# Patient Record
Sex: Female | Born: 1960
Health system: Southern US, Community
[De-identification: ages and names within clinical notes are randomized; demographics above are authoritative.]

## PROBLEM LIST (undated history)

## (undated) DIAGNOSIS — K219 Gastro-esophageal reflux disease without esophagitis: Secondary | ICD-10-CM

## (undated) DIAGNOSIS — K5900 Constipation, unspecified: Secondary | ICD-10-CM

## (undated) DIAGNOSIS — J069 Acute upper respiratory infection, unspecified: Secondary | ICD-10-CM

## (undated) DIAGNOSIS — M51369 Other intervertebral disc degeneration, lumbar region without mention of lumbar back pain or lower extremity pain: Secondary | ICD-10-CM

## (undated) DIAGNOSIS — I10 Essential (primary) hypertension: Secondary | ICD-10-CM

## (undated) DIAGNOSIS — N390 Urinary tract infection, site not specified: Secondary | ICD-10-CM

## (undated) DIAGNOSIS — M5136 Other intervertebral disc degeneration, lumbar region: Secondary | ICD-10-CM

## (undated) HISTORY — PX: INCONTINENCE SURGERY: SHX676

## (undated) HISTORY — PX: ABDOMINAL HYSTERECTOMY: SHX81

## (undated) HISTORY — PX: CARPAL TUNNEL RELEASE: SHX101

---

## 1998-09-13 ENCOUNTER — Emergency Department (HOSPITAL_COMMUNITY): Admission: EM | Admit: 1998-09-13 | Discharge: 1998-09-13 | Payer: Self-pay | Admitting: Family Medicine

## 1999-05-06 ENCOUNTER — Encounter: Payer: Self-pay | Admitting: Obstetrics and Gynecology

## 1999-05-06 ENCOUNTER — Ambulatory Visit (HOSPITAL_COMMUNITY): Admission: RE | Admit: 1999-05-06 | Discharge: 1999-05-06 | Payer: Self-pay | Admitting: Obstetrics and Gynecology

## 1999-06-01 ENCOUNTER — Encounter: Payer: Self-pay | Admitting: Occupational Medicine

## 1999-06-01 ENCOUNTER — Ambulatory Visit: Admission: RE | Admit: 1999-06-01 | Discharge: 1999-06-01 | Payer: Self-pay | Admitting: Occupational Medicine

## 2000-04-30 ENCOUNTER — Other Ambulatory Visit: Admission: RE | Admit: 2000-04-30 | Discharge: 2000-04-30 | Payer: Self-pay | Admitting: Obstetrics and Gynecology

## 2000-05-11 ENCOUNTER — Encounter: Payer: Self-pay | Admitting: Obstetrics and Gynecology

## 2000-05-11 ENCOUNTER — Ambulatory Visit (HOSPITAL_COMMUNITY): Admission: RE | Admit: 2000-05-11 | Discharge: 2000-05-11 | Payer: Self-pay | Admitting: Obstetrics and Gynecology

## 2000-11-19 ENCOUNTER — Emergency Department (HOSPITAL_COMMUNITY): Admission: EM | Admit: 2000-11-19 | Discharge: 2000-11-19 | Payer: Self-pay | Admitting: Emergency Medicine

## 2001-05-14 ENCOUNTER — Ambulatory Visit (HOSPITAL_COMMUNITY): Admission: RE | Admit: 2001-05-14 | Discharge: 2001-05-14 | Payer: Self-pay | Admitting: Obstetrics and Gynecology

## 2001-05-14 ENCOUNTER — Encounter: Payer: Self-pay | Admitting: Obstetrics and Gynecology

## 2001-06-03 ENCOUNTER — Other Ambulatory Visit: Admission: RE | Admit: 2001-06-03 | Discharge: 2001-06-03 | Payer: Self-pay | Admitting: Obstetrics and Gynecology

## 2002-03-25 ENCOUNTER — Encounter: Payer: Self-pay | Admitting: Urology

## 2002-03-27 ENCOUNTER — Observation Stay (HOSPITAL_COMMUNITY): Admission: RE | Admit: 2002-03-27 | Discharge: 2002-03-28 | Payer: Self-pay | Admitting: Urology

## 2002-05-16 ENCOUNTER — Ambulatory Visit (HOSPITAL_COMMUNITY): Admission: RE | Admit: 2002-05-16 | Discharge: 2002-05-16 | Payer: Self-pay | Admitting: Obstetrics and Gynecology

## 2002-05-16 ENCOUNTER — Encounter: Payer: Self-pay | Admitting: Obstetrics and Gynecology

## 2002-09-01 ENCOUNTER — Inpatient Hospital Stay (HOSPITAL_COMMUNITY): Admission: RE | Admit: 2002-09-01 | Discharge: 2002-09-04 | Payer: Self-pay | Admitting: Urology

## 2002-09-13 ENCOUNTER — Emergency Department (HOSPITAL_COMMUNITY): Admission: EM | Admit: 2002-09-13 | Discharge: 2002-09-13 | Payer: Self-pay | Admitting: Emergency Medicine

## 2003-02-19 ENCOUNTER — Encounter: Payer: Self-pay | Admitting: Obstetrics and Gynecology

## 2003-02-19 ENCOUNTER — Encounter: Admission: RE | Admit: 2003-02-19 | Discharge: 2003-02-19 | Payer: Self-pay | Admitting: Obstetrics and Gynecology

## 2003-05-04 ENCOUNTER — Ambulatory Visit (HOSPITAL_COMMUNITY): Admission: RE | Admit: 2003-05-04 | Discharge: 2003-05-04 | Payer: Self-pay | Admitting: Obstetrics and Gynecology

## 2003-05-04 ENCOUNTER — Encounter: Payer: Self-pay | Admitting: Obstetrics and Gynecology

## 2003-05-27 ENCOUNTER — Encounter: Admission: RE | Admit: 2003-05-27 | Discharge: 2003-05-27 | Payer: Self-pay | Admitting: Obstetrics and Gynecology

## 2003-05-27 ENCOUNTER — Encounter: Payer: Self-pay | Admitting: Obstetrics and Gynecology

## 2004-04-13 ENCOUNTER — Emergency Department (HOSPITAL_COMMUNITY): Admission: EM | Admit: 2004-04-13 | Discharge: 2004-04-13 | Payer: Self-pay | Admitting: Family Medicine

## 2004-04-20 ENCOUNTER — Ambulatory Visit (HOSPITAL_COMMUNITY): Admission: RE | Admit: 2004-04-20 | Discharge: 2004-04-20 | Payer: Self-pay | Admitting: Orthopedic Surgery

## 2004-04-20 ENCOUNTER — Encounter (INDEPENDENT_AMBULATORY_CARE_PROVIDER_SITE_OTHER): Payer: Self-pay | Admitting: *Deleted

## 2004-04-20 ENCOUNTER — Ambulatory Visit (HOSPITAL_BASED_OUTPATIENT_CLINIC_OR_DEPARTMENT_OTHER): Admission: RE | Admit: 2004-04-20 | Discharge: 2004-04-20 | Payer: Self-pay | Admitting: Orthopedic Surgery

## 2004-05-04 ENCOUNTER — Ambulatory Visit (HOSPITAL_COMMUNITY): Admission: RE | Admit: 2004-05-04 | Discharge: 2004-05-04 | Payer: Self-pay | Admitting: Obstetrics and Gynecology

## 2005-05-05 ENCOUNTER — Ambulatory Visit (HOSPITAL_COMMUNITY): Admission: RE | Admit: 2005-05-05 | Discharge: 2005-05-05 | Payer: Self-pay | Admitting: Obstetrics and Gynecology

## 2005-06-16 ENCOUNTER — Emergency Department (HOSPITAL_COMMUNITY): Admission: EM | Admit: 2005-06-16 | Discharge: 2005-06-16 | Payer: Self-pay | Admitting: Emergency Medicine

## 2005-06-28 ENCOUNTER — Emergency Department (HOSPITAL_COMMUNITY): Admission: EM | Admit: 2005-06-28 | Discharge: 2005-06-28 | Payer: Self-pay | Admitting: Family Medicine

## 2005-08-09 ENCOUNTER — Emergency Department (HOSPITAL_COMMUNITY): Admission: EM | Admit: 2005-08-09 | Discharge: 2005-08-09 | Payer: Self-pay | Admitting: Family Medicine

## 2006-06-28 ENCOUNTER — Ambulatory Visit (HOSPITAL_COMMUNITY): Admission: RE | Admit: 2006-06-28 | Discharge: 2006-06-28 | Payer: Self-pay | Admitting: Family Medicine

## 2007-07-03 ENCOUNTER — Emergency Department (HOSPITAL_COMMUNITY): Admission: EM | Admit: 2007-07-03 | Discharge: 2007-07-03 | Payer: Self-pay | Admitting: Family Medicine

## 2007-07-14 ENCOUNTER — Emergency Department (HOSPITAL_COMMUNITY): Admission: EM | Admit: 2007-07-14 | Discharge: 2007-07-14 | Payer: Self-pay | Admitting: Family Medicine

## 2007-07-16 ENCOUNTER — Ambulatory Visit (HOSPITAL_COMMUNITY): Admission: RE | Admit: 2007-07-16 | Discharge: 2007-07-16 | Payer: Self-pay | Admitting: Family Medicine

## 2007-11-09 ENCOUNTER — Emergency Department (HOSPITAL_COMMUNITY): Admission: EM | Admit: 2007-11-09 | Discharge: 2007-11-09 | Payer: Self-pay | Admitting: Emergency Medicine

## 2008-04-24 ENCOUNTER — Ambulatory Visit (HOSPITAL_BASED_OUTPATIENT_CLINIC_OR_DEPARTMENT_OTHER): Admission: RE | Admit: 2008-04-24 | Discharge: 2008-04-24 | Payer: Self-pay | Admitting: Urology

## 2009-01-27 ENCOUNTER — Emergency Department (HOSPITAL_COMMUNITY): Admission: EM | Admit: 2009-01-27 | Discharge: 2009-01-27 | Payer: Self-pay | Admitting: Family Medicine

## 2009-03-15 ENCOUNTER — Ambulatory Visit (HOSPITAL_COMMUNITY): Admission: RE | Admit: 2009-03-15 | Discharge: 2009-03-15 | Payer: Self-pay | Admitting: Family Medicine

## 2010-04-25 ENCOUNTER — Ambulatory Visit (HOSPITAL_COMMUNITY): Admission: RE | Admit: 2010-04-25 | Discharge: 2010-04-25 | Payer: Self-pay | Admitting: Family Medicine

## 2010-07-11 ENCOUNTER — Emergency Department (HOSPITAL_COMMUNITY): Admission: EM | Admit: 2010-07-11 | Discharge: 2010-07-11 | Payer: Self-pay | Admitting: Family Medicine

## 2010-07-11 ENCOUNTER — Ambulatory Visit (HOSPITAL_COMMUNITY): Admission: RE | Admit: 2010-07-11 | Discharge: 2010-07-11 | Payer: Self-pay | Admitting: Family Medicine

## 2010-07-14 ENCOUNTER — Encounter: Admission: RE | Admit: 2010-07-14 | Discharge: 2010-07-14 | Payer: Self-pay | Admitting: Family Medicine

## 2010-08-06 ENCOUNTER — Emergency Department (HOSPITAL_COMMUNITY): Admission: EM | Admit: 2010-08-06 | Discharge: 2010-08-06 | Payer: Self-pay | Admitting: Emergency Medicine

## 2011-01-16 ENCOUNTER — Encounter: Payer: Self-pay | Admitting: Family Medicine

## 2011-03-10 LAB — URINALYSIS, ROUTINE W REFLEX MICROSCOPIC
Glucose, UA: NEGATIVE mg/dL
Ketones, ur: NEGATIVE mg/dL
Leukocytes, UA: NEGATIVE
Specific Gravity, Urine: 1.011 (ref 1.005–1.030)
Urobilinogen, UA: 1 mg/dL (ref 0.0–1.0)
pH: 7 (ref 5.0–8.0)

## 2011-03-10 LAB — BASIC METABOLIC PANEL
BUN: 15 mg/dL (ref 6–23)
CO2: 32 mEq/L (ref 19–32)
Calcium: 9.5 mg/dL (ref 8.4–10.5)
Creatinine, Ser: 0.66 mg/dL (ref 0.4–1.2)
GFR calc Af Amer: >60 mL/min (ref >60)
Sodium: 141 mEq/L (ref 135–145)

## 2011-03-10 LAB — URINE MICROSCOPIC-ADD ON

## 2011-03-10 LAB — DIFFERENTIAL
Basophils Absolute: 0 10*3/uL (ref 0.0–0.1)
Basophils Relative: 1 % (ref 0–1)
Lymphocytes Relative: 37 % (ref 12–46)
Monocytes Absolute: 0.3 10*3/uL (ref 0.1–1.0)
Neutro Abs: 2.4 10*3/uL (ref 1.7–7.7)

## 2011-03-10 LAB — CBC
HCT: 36.9 % (ref 36.0–46.0)
Hemoglobin: 12.2 g/dL (ref 12.0–15.0)
MCV: 84.1 fL (ref 78.0–100.0)

## 2011-03-11 LAB — POCT URINALYSIS DIP (DEVICE)
Ketones, ur: NEGATIVE mg/dL
Nitrite: NEGATIVE
Specific Gravity, Urine: 1.025 (ref 1.005–1.030)
Urobilinogen, UA: 0.2 mg/dL (ref 0.0–1.0)
pH: 6.5 (ref 5.0–8.0)

## 2011-04-06 ENCOUNTER — Other Ambulatory Visit (HOSPITAL_COMMUNITY): Payer: Self-pay | Admitting: Family Medicine

## 2011-04-06 DIAGNOSIS — Z1231 Encounter for screening mammogram for malignant neoplasm of breast: Secondary | ICD-10-CM

## 2011-05-09 NOTE — Op Note (Signed)
NAMEOLIVE, ZMUDA             ACCOUNT NO.:  [id number]   MEDICAL RECORD NO.:  [id number]          PATIENT TYPE:  AMB   LOCATION:  NESC                         FACILITY:  [facility]   PHYSICIAN:  Excell Seltzer. Annabell Howells, M.D.    DATE OF BIRTH:  05-08-1961   DATE OF PROCEDURE:  04/24/2008  DATE OF DISCHARGE:                               OPERATIVE REPORT   PREOPERATIVE DIAGNOSIS:  Foreign body in the bladder.   POSTOPERATIVE DIAGNOSIS:  Foreign body in the bladder.   PROCEDURE PERFORMED:  Cystoscopy, removal of foreign bodies from  the  bladder, complicated   SURGEON:  Excell Seltzer. Annabell Howells, M.D.   ASSISTANT:  Melina Schools, M.D.   ANESTHESIA:  General.   INDICATIONS FOR PROCEDURE:  Ms. Janee Morn is a 50 year old female with a  history of a midurethral sling.  She has had recurrent urinary tract  infections.  Office cystoscopic examination revealed foreign body.  She  was brought to the operating room for removal.   DESCRIPTION OF PROCEDURE IN DETAIL:  The patient was brought to the  operating.  She is identified by arm band.  Informed consent was  verified and preoperative time-out was performed.  After successful  induction of anesthesia, the patient was moved to the dorsal lithotomy  position where all appropriate pressure points were padded to avoid  neuropraxic  compartment syndrome.  Perineum was prepped and draped.  Surgeon was Designer, jewellery.  A 22-French cystoscopic sheath was used  to introduce a 12 degree cystoscopic lens transurethrally into the  bladder.  Pan cystourethroscopy was performed.  Bilateral ureteral  orifices were noted to be in the normal anatomic position along the  trigone.  Each was seen to efflux clear urine.  On the patient's right  posterior bladder wall there was an area of inflammation with an obvious  foreign body that looked like a strand of mesh.  This was grasped with a  grasper.  Attempts to remove it were, at that point, unsuccessful with  grasper.   Please note at that time it was very difficult to keep the  bladder inflated because of leakage per urethra.   At this time we inserted the 500 micron laser fiber via the working port  of the scope.  Attempts to amputate the strand of mesh were  unsuccessful.  We then used biopsy forceps to cut the mesh.  We were  able to remove all exposed areas of mesh.  We then removed the 12-French  scope and inserted the 70 degree scope.  There were no other areas that  could be seen of exposed foreign body.  We then drained the bladder and  reinserted the scope.  There was a small amount of bleeding, but no  significant hemorrhage.  The area appeared to collapse upon itself.  The  decision was made not to fulgurate so as to allow healing.  At this  time, the cystoscope was removed.  An 57-  [place] Foley catheter was inserted transurethrally into the bladder and  balloon inflated with 10 mL sterile water.  It was placed to straight  drain.  At this time the procedure was terminated.  The patient  tolerated the procedure well.  Bjorn Pippin was the attending, primary and  responsible physician and was present and participated in the procedure.     ______________________________  Melina Schools, M.D.      Excell Seltzer. Annabell Howells, M.D.  Electronically Signed    JR/MEDQ  D:  04/24/2008  T:  04/24/2008  Job:  [id number]

## 2011-05-12 NOTE — Op Note (Signed)
Carla Frank, Carla Frank                      ACCOUNT NO.:  [id number]   MEDICAL RECORD NO.:  [id number]                   PATIENT TYPE:  INP   LOCATION:  S010                                 FACILITY:  [facility]   PHYSICIAN:  Claudette Laws, M.D.               DATE OF BIRTH:  07/15/1961   DATE OF PROCEDURE:  09/01/2002  DATE OF DISCHARGE:                                 OPERATIVE REPORT   PREOPERATIVE DIAGNOSIS:  Sling material erosion into bladder.   POSTOPERATIVE DIAGNOSIS:  Sling material erosion into bladder.   PROCEDURES:  1. Cystoscopy.  2. Suprapubic exploration with removal of polypropylene mesh material from     bladder.   SURGEON:  Claudette Laws, M.D.   ASSISTANT:  Sigmund I. Patsi Sears, M.D.   RESIDENT:  Crecencio Mc, MD   ANESTHESIA:  General.   COMPLICATIONS:  None.   ESTIMATED BLOOD LOSS:  50 cc.   INDICATION:  The patient is a 50 year old white female with a history of  stress urinary incontinence, status post a SPARC urethral sling.  The  patient subsequently presented a few month later with recurrent infections  and hematuria.  Upon office cystoscopy, the patient was noted to have  polypropylene mesh sling material in the lateral portions of each side of  the bladder.  After discussing options with the patient, it was elected to  proceed with intraoperative cystoscopy with removal of the mesh.  It was  explained that this would likely need to be done through an open suprapubic  approach.  Potential risks and benefits of the procedure were explained to  the patient, and she consented.   DESCRIPTION OF PROCEDURE:  The patient was taken to the operating room, and  a general anesthetic was administered.  The patient was placed in the dorsal  lithotomy position, administered preoperative antibiotics, and prepped and  draped in the usual sterile fashion.  Next, cystoscopy was performed with  the 12 and 70 degree lens.  This revealed an approximately 2 cm  piece of  mesh along the right lateral wall of the bladder up towards the dome.  In  addition, there was a smaller piece of mesh visible on the contralateral  side of the bladder.  Examination of the remainder of the bladder was  unremarkable.  The bladder was then filled with sterile water, and an 18  French Foley catheter was placed into the bladder and plugged.  Attention  then turned to the lower abdomen.  The patient's previous Pfannenstiel  incision was excised in an elliptical manner, and this incision was then  brought down through the subcutaneous tissues with Bovie electrocautery.  There was noted to be extensive scar tissue present.  This was carried down  to the level of the rectus fascia which was then divided in a transverse  fashion.  The midline was then identified, and this was opened in between  the rectus muscles.  The retropubic space was then identified, and the  bladder was carefully dissected from the abdominal wall on either side and  anteriorly from the pubis.  A small peritoneal incision was made just  superior to the bladder.  After mobilizing the bladder, inspection did  reveal a thin portion of the bladder anteriorly.  It was therefore decided  to begin the cystotomy incision at this site, and it was brought up towards  the dome of the bladder in the midline.  Allis clamps were then placed on  the lateral edges of the bladder, and a malleable retractor was placed in  the bladder to visualize the mucosa.  The mesh on the right side of the  bladder was first identified, and a right-angle was able to be passed  underneath this.  Electrocautery was then used to excise this portion of the  bladder mucosa which was then closed with a 4-0 Vicryl running suture.  In a  similar fashion, the mesh on the left side of the bladder was identified.  Again, a right-angle was able to be passed underneath it, and this area was  also excised to incorporate this portion of bladder  into the cystotomy  incision.  The bladder was then closed with a running 2-0 Vicryl full-  thickness suture.  A piece of Tutoplast mesh was then placed over the  cystotomy closure, and a layer of detrusor muscle was then closed over the  cadaveric mesh.  The bladder was then irrigated and appeared to be  watertight.  A #10 Blake drain was then placed in the retropubic space and  brought out through the abdomen through a separate stab incision.  The  peritoneal opening was then closed with a 2-0 Vicryl with care to avoid any  injury to the bowel.  The rectus fascia was then reapproximated with a  running #1 PDS suture.  The wound was then copiously irrigated, and the skin  was reapproximated with staples, and a dressing was applied.  A Foley  catheter was left to straight drain, and the procedure was ended.  There  were no complications, and the patient appeared to tolerate the procedure  well.  She was able to be awakened and transferred to the recovery unit in  satisfactory condition.  Please note that all sponge and needle counts were  noted to be correct at the end of the procedure.  Please note that Dr. Etta Grandchild  was the operating surgeon and was present and participated in this entire  procedure.     Crecencio Mc, M.D.                          Claudette Laws, M.D.    LB/MEDQ  D:  09/01/2002  T:  09/01/2002  Job:  [id number]

## 2011-05-12 NOTE — Op Note (Signed)
NAME:  Carla Frank, Carla Frank                       ACCOUNT NO.:  [id number]   MEDICAL RECORD NO.:  [id number]                   PATIENT TYPE:  AMB   LOCATION:  DSC                                  FACILITY:  [facility]   PHYSICIAN:  Artist Pais. Mina Marble, M.D.           DATE OF BIRTH:  January 03, 1961   DATE OF PROCEDURE:  04/20/2004  DATE OF DISCHARGE:                                 OPERATIVE REPORT   PREOPERATIVE DIAGNOSES:  Mass, right long finger, radial side, proximal  interphalangeal joint.   POSTOPERATIVE DIAGNOSES:  Mass, right long finger, radial side, proximal  interphalangeal joint.   PROCEDURE:  Excision of mass, deep right long finger.   SURGEON:  Artist Pais. Mina Marble, M.D.   ASSISTANT:  Aura Fey. Bobbe Medico.   ANESTHESIA:  General.   TOURNIQUET TIME:  21 minutes.   COMPLICATIONS:  None.   DRAINS:  None.   DESCRIPTION OF PROCEDURE:  The patient was taken to the operating room.  After the induction of adequate general anesthesia, the right upper  extremity was prepped and draped in the usual sterile fashion.  An Esmarch  was used to exsanguinate the limb.  Tourniquet was inflated to 250 mmHg.  At  this point in time, an oblique incision was made over mass just distal to  the proximal interphalangeal joint, radial side of the PIP joint, right long  finger.  Incision was taken down through the skin and subcutaneous tissue.  This was carried down to neurovascular bundle, identified and retracted with  Bovie.  The mass was excised along with the skin.  The wound was then  thoroughly irrigated and closed with 3-0 nylon.  A sterile dressing with  Xeroform, 4 x 4 fluffs, and Coban wrap was applied.  The patient tolerated  the procedure well  and went to recovery in stable fashion.                                               Artist Pais Mina Marble, M.D.    MAW/MEDQ  D:  04/20/2004  T:  04/20/2004  Job:  [id number]

## 2011-05-12 NOTE — Discharge Summary (Signed)
[facility]  Patient:    ZABDI, MIS Visit Number: [id number] MRN: [id number]          Service Type: SUR Location: 3W 5284 01 Attending Physician:  Monica Becton Dictated by:   Claudette Laws, M.D. Admit Date:  03/27/2002 Discharge Date: 03/28/2002                             Discharge Summary  OVERNIGHT OBSERVATION  HISTORY:  This is a 50 year old lady we have been following now for about two years with stress urinary incontinence.  She has tried various conservative measures without success.  We have discussed treatment options, and a decision was made to proceed with a pubovaginal sling procedure.  We discussed carefully with her preoperatively the nature of a sling, different types of material available, and what to expect postoperatively.  We mentioned complications such as postoperative chronic retention possibly requiring a takedown of the sling as well as just the opposite, failure of the sling to correct the incompetence.  We also mentioned the possibility of urgency/frequency syndrome developing after sling procedure.  I also mentioned the possibility of infection of the sling requiring removal.  She understands and agrees to the proposed surgery.  The patient otherwise is basically in good health.  She has had three children.  She has hypertension.  LABORATORY DATA:  Electrolytes were normal except for a potassium of 3.1 which we attributed to HCTZ.  This was not felt to be a contraindication to surgery. Her glucose was 117, BUN 6, creatinine 0.7.  Hemoglobin 11.7, hematocrit 34.7, white cell count 5200.  EKG showed normal sinus rhythm.  COURSE IN HOSPITAL:  The patient came in as an a.m. admission on March 27, 2002, and underwent a pubovaginal sling procedure using a Sparc sling. Postoperatively, I put in an 18-French Foley catheter, observed her overnight. By the next day, she was feeling well with not much discomfort.   We removed the vaginal packing.  I elected to leave the Foley in for about four days and then remove it in the office for a trial of voiding.  FINAL DIAGNOSES: 1. Stress urinary incontinence. 2. Hypertension.  OPERATION: 1. Pubovaginal sling procedure (Sparc). 2. Cystoscopy.  COMPLICATIONS:  None.  CONDITION UPON DISCHARGE:  Stable.  DISCHARGE MEDICATIONS:  To include Cipro 250 mg b.i.d., #14, and Tylox #20 p.r.n. pain.  She refused to renew her blood pressure medication.  DISPOSITION:  Regular diet, force fluids.  Limited activity.  Foley catheter to a leg bag, and return to see me in three days for catheter removal. Dictated by:   Claudette Laws, M.D. Attending Physician:  Monica Becton DD:  03/28/02 TD:  03/29/02 Job: [id number]

## 2011-05-12 NOTE — Op Note (Signed)
[facility]  Patient:    Carla Frank, Carla Frank Visit Number: [id number] MRN: [id number]          Service Type: Attending:  Claudette Laws, M.D. Dictated by:   Claudette Laws, M.D. Proc. Date: 03/27/02                             Operative Report  PREOPERATIVE DIAGNOSIS:  Stress urinary incontinence.  POSTOPERATIVE DIAGNOSIS:  Stress urinary incontinence.  OPERATION: 1. Pubovaginal sling procedure [facility]). 2. Cystoscopy.  SURGEON:  Claudette Laws, M.D.  DESCRIPTION OF PROCEDURE:  The patient was prepped and draped in the dorsal lithotomy position under LMA anesthesia.  The labia were sewn back with 2-0 silk sutures.  Vaginal speculum was placed as was a Foley catheter.  A 2 cm incision was made in the midline about midway between the bladder neck and the urethral meatus.  Dissection was then carried out laterally to just beneath the endopelvic fascia, then suprapubically, two puncture wounds were made on either side of the midline about 2 cm lateral.  Then using the [facility] needles, these were passed down suprapubically, walking down the back portion of the pubic bone using fingertip control for a puncture of the ureteropelvic ligament.  Cystoscopy was then performed, showing an inadvertent puncture on the left side.  The needle was then repassed, and recystoscoped examination showing no bladder perforation.  SPARC sling was then attached to the ends of the needles and then brought up suprapubically.  I left a pair of Mayo scissors between the urethra and the sling, making sure that the sling was not too tight at the final position.  We then cut off the needles suprapubically and cut the sling just beneath the skin suprapubic area.  Copious amounts of bug juice were used.  The plastic sheaths were then removed.  We then closed the vaginal incision with a running 2-0 Vicryl suture.  Iodoform for vaginal packing, and Bacitracin ointment.  An 46 French  10 cc Foley catheter.  I closed the two incisions suprapubically with a subcuticular 4-0 Vicryl stitch. The patient was then taken back to the recovery room in satisfactory condition. Dictated by:   Claudette Laws, M.D. Attending:  Claudette Laws, M.D. DD:  03/27/02 TD:  03/27/02 Job: [id number]

## 2011-05-12 NOTE — Discharge Summary (Signed)
NAME:  Carla Frank, Carla Frank                       ACCOUNT NO.:  [id number]   MEDICAL RECORD NO.:  [id number]                   PATIENT TYPE:  INP   LOCATION:  0355                                 FACILITY[facility]  [facility]   PHYSICIAN:  Claudette Laws, M.D.               DATE OF BIRTH:  21-May-1961   DATE OF ADMISSION:  09/01/2002  DATE OF DISCHARGE:  09/04/2002                                 DISCHARGE SUMMARY   HISTORY:  This is a 50 year old lady who about four months ago underwent a  pubovaginal sling using a SPARC mesh.  Postoperatively she did well except  for some hematuria and some recurrent UTIs and then flexible cystoscopy  recently revealed that the sling had eroded into the bladder near the dome  bilaterally.  After a consultation it was felt that the best option would be  open bladder exploration and removal of the mesh.  The patient agrees to the  proposed surgery.  We discussed complications such as postoperative wound  infection, bleeding, possible having to remove the entire sling if it  becomes infected.  She understands and agrees to the proposed surgery.   LABORATORY DATA:  Electrolytes were normal except for potassium 3.4, BUN 12,  creatinine 1.0.  Hemoglobin 12.3, hematocrit 37.2.   HOSPITAL COURSE:  The patient came in as an a.m. admission on September 01, 2002.  Underwent suprapubic exploration with a cystotomy and removal of the  polypropylene mesh from the bladder on both sides.  Postoperatively we left  the Foley catheter in place and also a 10 [place] JP drain.  Over the next  three days she had a slow progressive course, but at discharge was afebrile,  feeling well.  The catheter was draining well.  There was minimal amount of  bleeding which I anticipate will clear up.  By the third day there was  minimal postoperative JP drainage so we removed the drain and she was sent  home to have her come back in five days for skin staple removal.  At  discharge she was eating  well.  Incision was healing nicely and we plan to  keep the Foley in now for two to three weeks.  She has a very thin bladder.  We also placed some Tutoplast over the cystotomy site to help with bladder  closure as it was a very difficult closure.   DISCHARGE DIAGNOSES:  Polyethylene sling material in bladder secondary to  erosion following a SPARC pubovaginal sling procedure.   OPERATION:  Cystoscopy with bladder exploration, cystotomy, and excision of  mesh from the bladder.   COMPLICATIONS:  None.   CONDITION ON DISCHARGE:  Recovery.   DISCHARGE MEDICATIONS:  Home medications plus Tylox number 30 p.r.n. pain  and Cipro 250 b.i.d. number 14.   DISCHARGE INSTRUCTIONS:  Return to the office in five days for staple  removal.  Claudette Laws, M.D.    RFS/MEDQ  D:  09/04/2002  T:  09/04/2002  Job:  [id number]

## 2011-06-05 ENCOUNTER — Ambulatory Visit (HOSPITAL_COMMUNITY)
Admission: RE | Admit: 2011-06-05 | Discharge: 2011-06-05 | Disposition: A | Discharge status: Home / Self Care | Payer: BC Managed Care – HMO | Source: Ambulatory Visit | Attending: Family Medicine | Admitting: Family Medicine

## 2011-06-05 DIAGNOSIS — Z1231 Encounter for screening mammogram for malignant neoplasm of breast: Secondary | ICD-10-CM | POA: Insufficient documentation

## 2011-09-20 LAB — POCT I-STAT, CHEM 8
Chloride: 100
Creatinine, Ser: 0.9
Hemoglobin: 13.9
Sodium: 141

## 2011-10-03 LAB — DIFFERENTIAL
Basophils Absolute: 0
Basophils Relative: 0
Eosinophils Absolute: 0 — ABNORMAL LOW
Eosinophils Relative: 0
Monocytes Relative: 9

## 2011-10-03 LAB — BASIC METABOLIC PANEL
BUN: 5 — ABNORMAL LOW
CO2: 32
Calcium: 9.1
Creatinine, Ser: 0.86
Potassium: 3.4 — ABNORMAL LOW
Sodium: 138

## 2011-10-03 LAB — URINALYSIS, ROUTINE W REFLEX MICROSCOPIC
Bilirubin Urine: NEGATIVE
Protein, ur: 100 — AB
Specific Gravity, Urine: 1.021
pH: 6

## 2011-10-03 LAB — CBC
HCT: 32.8 — ABNORMAL LOW
Hemoglobin: 11.4 — ABNORMAL LOW
RBC: 3.95
WBC: 8.6

## 2011-10-03 LAB — URINE MICROSCOPIC-ADD ON

## 2011-10-09 LAB — URINE CULTURE: Colony Count: >=100000

## 2011-10-09 LAB — POCT URINALYSIS DIP (DEVICE)
Glucose, UA: NEGATIVE
Nitrite: NEGATIVE
Operator id: 235561
Specific Gravity, Urine: 1.015
Urobilinogen, UA: 1
pH: 6

## 2011-10-10 LAB — URINE CULTURE: Colony Count: 50000

## 2011-10-10 LAB — POCT URINALYSIS DIP (DEVICE)
Bilirubin Urine: NEGATIVE
Ketones, ur: NEGATIVE
Nitrite: NEGATIVE
pH: 7

## 2011-12-07 ENCOUNTER — Other Ambulatory Visit: Payer: Self-pay | Admitting: Orthopedic Surgery

## 2011-12-07 DIAGNOSIS — G8929 Other chronic pain: Secondary | ICD-10-CM

## 2011-12-07 DIAGNOSIS — M545 Low back pain, unspecified: Secondary | ICD-10-CM

## 2011-12-07 DIAGNOSIS — M542 Cervicalgia: Secondary | ICD-10-CM

## 2011-12-10 ENCOUNTER — Ambulatory Visit
Admission: RE | Admit: 2011-12-10 | Discharge: 2011-12-10 | Disposition: A | Discharge status: Home / Self Care | Payer: BC Managed Care – HMO | Source: Ambulatory Visit | Attending: Orthopedic Surgery | Admitting: Orthopedic Surgery

## 2011-12-10 DIAGNOSIS — M545 Low back pain, unspecified: Secondary | ICD-10-CM

## 2011-12-10 DIAGNOSIS — G8929 Other chronic pain: Secondary | ICD-10-CM

## 2012-05-14 ENCOUNTER — Other Ambulatory Visit (HOSPITAL_COMMUNITY): Payer: Self-pay | Admitting: Obstetrics and Gynecology

## 2012-05-14 DIAGNOSIS — Z1231 Encounter for screening mammogram for malignant neoplasm of breast: Secondary | ICD-10-CM

## 2012-06-07 ENCOUNTER — Ambulatory Visit (HOSPITAL_COMMUNITY)
Admission: RE | Admit: 2012-06-07 | Discharge: 2012-06-07 | Disposition: A | Discharge status: Home / Self Care | Payer: BC Managed Care – HMO | Source: Ambulatory Visit | Attending: Obstetrics and Gynecology | Admitting: Obstetrics and Gynecology

## 2012-06-07 DIAGNOSIS — Z1231 Encounter for screening mammogram for malignant neoplasm of breast: Secondary | ICD-10-CM | POA: Insufficient documentation

## 2013-05-13 ENCOUNTER — Other Ambulatory Visit (HOSPITAL_COMMUNITY): Payer: Self-pay | Admitting: Family Medicine

## 2013-05-13 DIAGNOSIS — Z1231 Encounter for screening mammogram for malignant neoplasm of breast: Secondary | ICD-10-CM

## 2013-06-05 ENCOUNTER — Ambulatory Visit (HOSPITAL_COMMUNITY): Payer: BC Managed Care – HMO

## 2013-06-09 ENCOUNTER — Ambulatory Visit (HOSPITAL_COMMUNITY): Payer: BC Managed Care – PPO

## 2013-06-12 ENCOUNTER — Ambulatory Visit (HOSPITAL_COMMUNITY)
Admission: RE | Admit: 2013-06-12 | Discharge: 2013-06-12 | Disposition: A | Discharge status: Home / Self Care | Payer: BC Managed Care – PPO | Source: Ambulatory Visit | Attending: Family Medicine | Admitting: Family Medicine

## 2013-06-12 DIAGNOSIS — Z1231 Encounter for screening mammogram for malignant neoplasm of breast: Secondary | ICD-10-CM | POA: Insufficient documentation

## 2013-07-11 ENCOUNTER — Emergency Department (HOSPITAL_COMMUNITY): Payer: BC Managed Care – HMO

## 2013-07-11 ENCOUNTER — Encounter (HOSPITAL_COMMUNITY): Payer: Self-pay | Admitting: *Deleted

## 2013-07-11 ENCOUNTER — Emergency Department (HOSPITAL_COMMUNITY)
Admission: EM | Admit: 2013-07-11 | Discharge: 2013-07-11 | Disposition: A | Discharge status: Home / Self Care | Payer: BC Managed Care – HMO | Attending: Emergency Medicine | Admitting: Emergency Medicine

## 2013-07-11 DIAGNOSIS — R42 Dizziness and giddiness: Secondary | ICD-10-CM | POA: Insufficient documentation

## 2013-07-11 HISTORY — DX: Essential (primary) hypertension: I10

## 2013-07-11 LAB — CBC WITH DIFFERENTIAL/PLATELET
Lymphocytes Relative: 28 % (ref 12–46)
Lymphs Abs: 1 10*3/uL (ref 0.7–4.0)
Neutro Abs: 2.1 10*3/uL (ref 1.7–7.7)
Neutrophils Relative %: 61 % (ref 43–77)
Platelets: 240 10*3/uL (ref 150–400)
RBC: 4.34 MIL/uL (ref 3.87–5.11)
WBC: 3.4 10*3/uL — ABNORMAL LOW (ref 4.0–10.5)

## 2013-07-11 LAB — POCT I-STAT TROPONIN I: Troponin i, poc: 0 ng/mL (ref 0.00–0.08)

## 2013-07-11 LAB — BASIC METABOLIC PANEL
CO2: 28 mEq/L (ref 19–32)
GFR calc non Af Amer: >90 mL/min (ref >90)
Glucose, Bld: 121 mg/dL — ABNORMAL HIGH (ref 70–99)
Potassium: 3.3 mEq/L — ABNORMAL LOW (ref 3.5–5.1)
Sodium: 139 mEq/L (ref 135–145)

## 2013-07-11 LAB — URINALYSIS, ROUTINE W REFLEX MICROSCOPIC
Glucose, UA: NEGATIVE mg/dL
Hgb urine dipstick: NEGATIVE
Specific Gravity, Urine: 1.009 (ref 1.005–1.030)
Urobilinogen, UA: 1 mg/dL (ref 0.0–1.0)

## 2013-07-11 LAB — URINE MICROSCOPIC-ADD ON

## 2013-07-11 MED ORDER — LORAZEPAM 2 MG/ML IJ SOLN
1.0000 mg | Freq: Once | INTRAMUSCULAR | Status: AC
  Administered 2013-07-11: 1 mg via INTRAVENOUS

## 2013-07-11 MED ORDER — SODIUM CHLORIDE 0.9 % IV BOLUS (SEPSIS)
1000.0000 mL | Freq: Once | INTRAVENOUS | Status: AC
  Administered 2013-07-11: 1000 mL via INTRAVENOUS

## 2013-07-11 MED ORDER — MECLIZINE HCL 25 MG PO TABS
25.0000 mg | ORAL_TABLET | Freq: Once | ORAL | Status: AC
  Administered 2013-07-11: 25 mg via ORAL
  Filled 2013-07-11: qty 1

## 2013-07-11 MED ORDER — MECLIZINE HCL 50 MG PO TABS: 50.0000 mg | ORAL_TABLET | Freq: Three times a day (TID) | ORAL | Status: DC | PRN

## 2013-07-11 MED ORDER — ONDANSETRON HCL 4 MG/2ML IJ SOLN
4.0000 mg | Freq: Once | INTRAMUSCULAR | Status: AC
  Administered 2013-07-11: 4 mg via INTRAVENOUS
  Filled 2013-07-11: qty 2

## 2013-07-11 MED ORDER — LORAZEPAM 2 MG/ML IJ SOLN
INTRAMUSCULAR | Status: AC
  Filled 2013-07-11: qty 1

## 2013-07-11 MED ORDER — ONDANSETRON 4 MG PO TBDP
4.0000 mg | ORAL_TABLET | Freq: Once | ORAL | Status: DC
  Filled 2013-07-11: qty 1

## 2013-07-11 MED ORDER — GADOBENATE DIMEGLUMINE 529 MG/ML IV SOLN
20.0000 mL | Freq: Once | INTRAVENOUS | Status: AC | PRN
  Administered 2013-07-11: 17 mL via INTRAVENOUS

## 2013-07-11 NOTE — ED Notes (Signed)
Pt states that she is feeling less dizzy at the present.  No distress noted.  Resp symmetrical and unlabored.

## 2013-07-11 NOTE — ED Provider Notes (Signed)
Medical screening examination/treatment/procedure(s) were conducted as a shared visit with non-physician practitioner(s) and myself.  I personally evaluated the patient during the encounter  Episodes of "dizziness" x 3 weeks described as lightheadedness, sometimes vertigo. No chest pain or SOB. EKG nsr. Doubt ACS. MRI to r/o central cause of vertigo. CN 2-12 intact, no ataxia on finger to nose, no nystagmus, 5/5 strength throughout, no pronator drift, Romberg negative, normal gait, visual fields full to confrontation.   Glynn Octave, MD 07/11/13 [phone]

## 2013-07-11 NOTE — ED Notes (Signed)
Pt reports that over the last 3 weeks she has been having difficulty ambulating due to dizziness.  Her primary MD has been reportedly changing her medications.

## 2013-07-11 NOTE — ED Provider Notes (Signed)
History    CSN: [id number] Arrival date & time 07/11/13  0716  First MD Initiated Contact with Patient 07/11/13 [phone]     No chief complaint on file.  (Consider location/radiation/quality/duration/timing/severity/associated sxs/prior Treatment) HPI  52 year old female with history of hypertension presents for evaluations for dizziness. Patient reports intermittent bouts of dizziness ongoing for the past 3 weeks. Describe dizziness as initially lightheadedness but now has room spinning sensation. Symptoms usually lasting for about 30 minutes, usually worsen with motion change, however now has been progressively getting worse. Endorse mild nausea only when she is dizzy.  Endorse occasional throbbing headache to the back of her head lasting for less than 30 minutes. No headache at this time. No complaint of fever, chills, ear pain, ringing in the ears, sneeze, runny nose, cough, sore throat, chest pain, shortness of breath, abdominal pain, dysuria, abnormal bleeding, or rash. Patient has been drinking as usual. Since the development of the symptoms patient has been seen by her primary care doctor who has alternative blood pressure medication, however this has not improved symptoms. Patient otherwise denies any prior history of stroke, nonsmoker, denies any confusion, having numbness or weakness.  No past medical history on file. No past surgical history on file. No family history on file. History  Substance Use Topics  . Smoking status: Not on file  . Smokeless tobacco: Not on file  . Alcohol Use: Not on file   OB History   No data available     Review of Systems  All other systems reviewed and are negative.    Allergies  Review of patient's allergies indicates not on file.  Home Medications  No current outpatient prescriptions on file. There were no vitals taken for this visit. Physical Exam  Nursing note and vitals reviewed. Constitutional: She is oriented to person, place,  and time. She appears well-developed and well-nourished. No distress.  Awake, alert, nontoxic appearance  HENT:  Head: Atraumatic.  Eyes: Conjunctivae are normal. Right eye exhibits no discharge. Left eye exhibits no discharge.  Neck: Neck supple.  Cardiovascular: Normal rate and regular rhythm.   Pulmonary/Chest: Effort normal. No respiratory distress. She exhibits no tenderness.  Abdominal: Soft. There is no tenderness. There is no rebound.  Musculoskeletal: She exhibits no tenderness.  ROM appears intact, no obvious focal weakness  Neurological: She is alert and oriented to person, place, and time.  Speech clear, pupils equal round reactive to light, extraocular movements intact   Normal peripheral visual fields Cranial nerves III through XII normal including no facial droop Follows commands, moves all extremities x4, normal strength to bilateral upper and lower extremities at all major muscle groups including grip Sensation normal to light touch and pinprick Coordination intact, no limb ataxia, finger-nose-finger normal Rapid alternating movements normal No pronator drift Gait normal   Skin: No rash noted.  Psychiatric: She has a normal mood and affect.    ED Course  Procedures (including critical care time)   Date: 07/11/2013  Rate: 85  Rhythm: normal sinus rhythm  QRS Axis: normal  Intervals: normal  ST/T Wave abnormalities: nonspecific ST/T changes  Conduction Disutrbances:right bundle branch block  Narrative Interpretation:   Old EKG Reviewed: none available    7:45 AM Pt with c/o dizziness.  Normal orthostatic VS.  ECG showing RBBB, no prior for comparison.  Although it has some component to suggest peripheral vertigo, due to the duration and her sxs persists with rest, pt will benefit from further eval to r/o central causes.  Sxs not worsen after Dix-Hallpike maneuver.    9:02 AM My attending has evaluate pt, and we both felt pt will benefit from MRA head/neck,  MRI brain to r/o posterior circulation stroke.  Pt otherwise felt a bit better after receiving meclizine.    1:24 PM Pt felt better.  MRI/MRA head and neck without acute finding concerning for stroke.  Will d/c with meclizine, pt to f/u with PCP for further care.  Return precaution discussed. Care discussed with attending.    Labs Reviewed  CBC WITH DIFFERENTIAL - Abnormal; Notable for the following:    WBC 3.4 (*)    Hemoglobin 11.6 (*)    HCT 35.5 (*)    All other components within normal limits  BASIC METABOLIC PANEL - Abnormal; Notable for the following:    Potassium 3.3 (*)    Glucose, Bld 121 (*)    All other components within normal limits  URINALYSIS, ROUTINE W REFLEX MICROSCOPIC - Abnormal; Notable for the following:    Leukocytes, UA TRACE (*)    All other components within normal limits  URINE MICROSCOPIC-ADD ON - Abnormal; Notable for the following:    Squamous Epithelial / LPF FEW (*)    All other components within normal limits  POCT I-STAT TROPONIN I   Mr Angiogram Head Wo Contrast  07/11/2013   *RADIOLOGY REPORT*  Clinical Data:  Dizziness.  High blood pressure.  MRI BRAIN WITH AND WITHOUT CONTRAST MRA HEAD WITHOUT CONTRAST MRA NECK WITHOUT AND WITH CONTRAST  Technique: Multiplanar, multiecho pulse sequences of the brain and surrounding structures were obtained according to standard protocol with and without intravenous contrast.  Angiographic images of the Circle of Willis were obtained using MRA technique without intravenous contrast.  Angiographic images of the neck were obtained using MRA technique without and with intravenous contrast.  Contrast:17 ml MultiHance.  Comparison:No comparison head CT or brain MR.  MRI HEAD  Findings: No acute infarct.  No intracranial hemorrhage.  Minimal nonspecific white matter type changes may be related to small vessel disease in this hypertensive patient.  No hydrocephalus.  No intracranial mass lesion detected on this unenhanced exam.   Cerebellar tonsils minimally low-lying but within the range of normal limits.  Partially empty sella.  Pineal region unremarkable.  Mild exophthalmos.  Minimal paranasal sinus mucosal thickening.  Mild spinal stenosis C3-4 and C4-5.  IMPRESSION: No acute infarct.  Please see above.  MRA HEAD  Findings:Anterior circulation without medium or large size vessel significant stenosis or occlusion.  Ectatic vertebral arteries and basilar artery.  No high-grade stenosis.  Minimal irregularity.  Mild irregularity and narrowing of portions of the superior cerebellar artery bilaterally more notable on the left.  Minimal bulge middle cerebral artery bilaterally appears to be origin of a vessel rather than aneurysm.  IMPRESSION: No medium or large size vessel significant stenosis or occlusion as noted above.  MRA NECK  Findings:There may be a common origin of the left common carotid artery and innominate artery.  Mild narrowing proximal left internal carotid artery without hemodynamically significant stenosis or irregularity.  No significant narrowing of the proximal right internal carotid artery.  Mid to distal aspect of the vertical cervical segment of the internal carotid artery is markedly ectatic bilaterally without irregularity to suggest the presence of underlying fibromuscular dysplasia.  Codominant vertebral arteries without significant narrowing.  No significant narrowing of the subclavian arteries.  IMPRESSION: No evidence of hemodynamically significant stenosis involving either carotid bifurcation or vertebral arteries.  Markedly ectatic  internal carotid artery cervical segment without findings of fibromuscular dysplasia.   Original Report Authenticated By: Lacy Duverney, M.D.   Mr Angiogram Neck W Wo Contrast  07/11/2013   *RADIOLOGY REPORT*  Clinical Data:  Dizziness.  High blood pressure.  MRI BRAIN WITH AND WITHOUT CONTRAST MRA HEAD WITHOUT CONTRAST MRA NECK WITHOUT AND WITH CONTRAST  Technique: Multiplanar,  multiecho pulse sequences of the brain and surrounding structures were obtained according to standard protocol with and without intravenous contrast.  Angiographic images of the Circle of Willis were obtained using MRA technique without intravenous contrast.  Angiographic images of the neck were obtained using MRA technique without and with intravenous contrast.  Contrast:17 ml MultiHance.  Comparison:No comparison head CT or brain MR.  MRI HEAD  Findings: No acute infarct.  No intracranial hemorrhage.  Minimal nonspecific white matter type changes may be related to small vessel disease in this hypertensive patient.  No hydrocephalus.  No intracranial mass lesion detected on this unenhanced exam.  Cerebellar tonsils minimally low-lying but within the range of normal limits.  Partially empty sella.  Pineal region unremarkable.  Mild exophthalmos.  Minimal paranasal sinus mucosal thickening.  Mild spinal stenosis C3-4 and C4-5.  IMPRESSION: No acute infarct.  Please see above.  MRA HEAD  Findings:Anterior circulation without medium or large size vessel significant stenosis or occlusion.  Ectatic vertebral arteries and basilar artery.  No high-grade stenosis.  Minimal irregularity.  Mild irregularity and narrowing of portions of the superior cerebellar artery bilaterally more notable on the left.  Minimal bulge middle cerebral artery bilaterally appears to be origin of a vessel rather than aneurysm.  IMPRESSION: No medium or large size vessel significant stenosis or occlusion as noted above.  MRA NECK  Findings:There may be a common origin of the left common carotid artery and innominate artery.  Mild narrowing proximal left internal carotid artery without hemodynamically significant stenosis or irregularity.  No significant narrowing of the proximal right internal carotid artery.  Mid to distal aspect of the vertical cervical segment of the internal carotid artery is markedly ectatic bilaterally without irregularity  to suggest the presence of underlying fibromuscular dysplasia.  Codominant vertebral arteries without significant narrowing.  No significant narrowing of the subclavian arteries.  IMPRESSION: No evidence of hemodynamically significant stenosis involving either carotid bifurcation or vertebral arteries.  Markedly ectatic internal carotid artery cervical segment without findings of fibromuscular dysplasia.   Original Report Authenticated By: Lacy Duverney, M.D.   Mr Brain Wo Contrast  07/11/2013   *RADIOLOGY REPORT*  Clinical Data:  Dizziness.  High blood pressure.  MRI BRAIN WITH AND WITHOUT CONTRAST MRA HEAD WITHOUT CONTRAST MRA NECK WITHOUT AND WITH CONTRAST  Technique: Multiplanar, multiecho pulse sequences of the brain and surrounding structures were obtained according to standard protocol with and without intravenous contrast.  Angiographic images of the Circle of Willis were obtained using MRA technique without intravenous contrast.  Angiographic images of the neck were obtained using MRA technique without and with intravenous contrast.  Contrast:17 ml MultiHance.  Comparison:No comparison head CT or brain MR.  MRI HEAD  Findings: No acute infarct.  No intracranial hemorrhage.  Minimal nonspecific white matter type changes may be related to small vessel disease in this hypertensive patient.  No hydrocephalus.  No intracranial mass lesion detected on this unenhanced exam.  Cerebellar tonsils minimally low-lying but within the range of normal limits.  Partially empty sella.  Pineal region unremarkable.  Mild exophthalmos.  Minimal paranasal sinus mucosal thickening.  Mild  spinal stenosis C3-4 and C4-5.  IMPRESSION: No acute infarct.  Please see above.  MRA HEAD  Findings:Anterior circulation without medium or large size vessel significant stenosis or occlusion.  Ectatic vertebral arteries and basilar artery.  No high-grade stenosis.  Minimal irregularity.  Mild irregularity and narrowing of portions of the  superior cerebellar artery bilaterally more notable on the left.  Minimal bulge middle cerebral artery bilaterally appears to be origin of a vessel rather than aneurysm.  IMPRESSION: No medium or large size vessel significant stenosis or occlusion as noted above.  MRA NECK  Findings:There may be a common origin of the left common carotid artery and innominate artery.  Mild narrowing proximal left internal carotid artery without hemodynamically significant stenosis or irregularity.  No significant narrowing of the proximal right internal carotid artery.  Mid to distal aspect of the vertical cervical segment of the internal carotid artery is markedly ectatic bilaterally without irregularity to suggest the presence of underlying fibromuscular dysplasia.  Codominant vertebral arteries without significant narrowing.  No significant narrowing of the subclavian arteries.  IMPRESSION: No evidence of hemodynamically significant stenosis involving either carotid bifurcation or vertebral arteries.  Markedly ectatic internal carotid artery cervical segment without findings of fibromuscular dysplasia.   Original Report Authenticated By: Lacy Duverney, M.D.   1. Vertigo     MDM  BP 110/74  Pulse 69  Temp(Src) 99.2 F (37.3 C) (Oral)  Resp 14  Ht 5\' 4"  (1.626 m)  Wt 181 lb (82.101 kg)  BMI 31.05 kg/m2  SpO2 97%  I have reviewed nursing notes and vital signs. I personally reviewed the imaging tests through PACS system  I reviewed available ER/hospitalization records thought the EMR       Fayrene Helper, [place] 07/11/13 1331

## 2013-07-11 NOTE — ED Notes (Signed)
Patient transported to MRI 

## 2014-02-17 ENCOUNTER — Other Ambulatory Visit: Payer: Self-pay | Admitting: Family Medicine

## 2014-02-17 ENCOUNTER — Ambulatory Visit
Admission: RE | Admit: 2014-02-17 | Discharge: 2014-02-17 | Disposition: A | Discharge status: Home / Self Care | Payer: BC Managed Care – PPO | Source: Ambulatory Visit | Attending: Family Medicine | Admitting: Family Medicine

## 2014-02-17 DIAGNOSIS — M545 Low back pain, unspecified: Secondary | ICD-10-CM

## 2014-02-20 ENCOUNTER — Other Ambulatory Visit: Payer: Self-pay | Admitting: Family Medicine

## 2014-02-20 DIAGNOSIS — M545 Low back pain, unspecified: Secondary | ICD-10-CM

## 2014-04-17 ENCOUNTER — Other Ambulatory Visit: Payer: Self-pay | Admitting: Neurosurgery

## 2014-05-30 DIAGNOSIS — J069 Acute upper respiratory infection, unspecified: Secondary | ICD-10-CM

## 2014-05-30 HISTORY — DX: Acute upper respiratory infection, unspecified: J06.9

## 2014-06-08 ENCOUNTER — Other Ambulatory Visit (HOSPITAL_COMMUNITY): Payer: Self-pay | Admitting: Obstetrics and Gynecology

## 2014-06-08 DIAGNOSIS — Z1231 Encounter for screening mammogram for malignant neoplasm of breast: Secondary | ICD-10-CM

## 2014-06-11 ENCOUNTER — Other Ambulatory Visit: Payer: Self-pay | Admitting: Obstetrics and Gynecology

## 2014-06-11 DIAGNOSIS — N63 Unspecified lump in unspecified breast: Secondary | ICD-10-CM

## 2014-06-16 ENCOUNTER — Ambulatory Visit (HOSPITAL_COMMUNITY): Payer: BC Managed Care – PPO

## 2014-06-18 ENCOUNTER — Ambulatory Visit
Admission: RE | Admit: 2014-06-18 | Discharge: 2014-06-18 | Disposition: A | Discharge status: Home / Self Care | Payer: BC Managed Care – PPO | Source: Ambulatory Visit | Attending: Obstetrics and Gynecology | Admitting: Obstetrics and Gynecology

## 2014-06-18 DIAGNOSIS — N63 Unspecified lump in unspecified breast: Secondary | ICD-10-CM

## 2014-07-06 ENCOUNTER — Encounter (HOSPITAL_COMMUNITY): Payer: Self-pay | Admitting: Pharmacy Technician

## 2014-07-08 ENCOUNTER — Encounter (HOSPITAL_COMMUNITY): Admission: RE | Admit: 2014-07-08 | Payer: BC Managed Care – PPO | Source: Ambulatory Visit

## 2014-07-08 ENCOUNTER — Encounter (HOSPITAL_COMMUNITY)
Admission: RE | Admit: 2014-07-08 | Discharge: 2014-07-08 | Disposition: A | Discharge status: Home / Self Care | Payer: BC Managed Care – PPO | Source: Ambulatory Visit | Attending: Neurosurgery | Admitting: Neurosurgery

## 2014-07-08 ENCOUNTER — Ambulatory Visit (HOSPITAL_COMMUNITY)
Admission: RE | Admit: 2014-07-08 | Discharge: 2014-07-08 | Disposition: A | Discharge status: Home / Self Care | Payer: BC Managed Care – PPO | Source: Ambulatory Visit | Attending: Anesthesiology | Admitting: Anesthesiology

## 2014-07-08 ENCOUNTER — Encounter (HOSPITAL_COMMUNITY): Payer: Self-pay

## 2014-07-08 DIAGNOSIS — I10 Essential (primary) hypertension: Secondary | ICD-10-CM | POA: Insufficient documentation

## 2014-07-08 DIAGNOSIS — Z01818 Encounter for other preprocedural examination: Secondary | ICD-10-CM | POA: Insufficient documentation

## 2014-07-08 HISTORY — DX: Urinary tract infection, site not specified: N39.0

## 2014-07-08 HISTORY — DX: Acute upper respiratory infection, unspecified: J06.9

## 2014-07-08 HISTORY — DX: Other intervertebral disc degeneration, lumbar region: M51.36

## 2014-07-08 HISTORY — DX: Other intervertebral disc degeneration, lumbar region without mention of lumbar back pain or lower extremity pain: M51.369

## 2014-07-08 HISTORY — DX: Constipation, unspecified: K59.00

## 2014-07-08 HISTORY — DX: Gastro-esophageal reflux disease without esophagitis: K21.9

## 2014-07-08 LAB — CBC WITH DIFFERENTIAL/PLATELET
BASOS ABS: 0 10*3/uL (ref 0.0–0.1)
BASOS PCT: 1 % (ref 0–1)
EOS PCT: 3 % (ref 0–5)
Eosinophils Absolute: 0.1 10*3/uL (ref 0.0–0.7)
HEMATOCRIT: 36.7 % (ref 36.0–46.0)
Hemoglobin: 11.9 g/dL — ABNORMAL LOW (ref 12.0–15.0)
LYMPHS PCT: 31 % (ref 12–46)
Lymphs Abs: 1.2 10*3/uL (ref 0.7–4.0)
MCH: 26.7 pg (ref 26.0–34.0)
MCHC: 32.4 g/dL (ref 30.0–36.0)
MCV: 82.3 fL (ref 78.0–100.0)
MONO ABS: 0.3 10*3/uL (ref 0.1–1.0)
Monocytes Relative: 8 % (ref 3–12)
NEUTROS ABS: 2.3 10*3/uL (ref 1.7–7.7)
Neutrophils Relative %: 57 % (ref 43–77)
PLATELETS: 240 10*3/uL (ref 150–400)
RBC: 4.46 MIL/uL (ref 3.87–5.11)
RDW: 13.8 % (ref 11.5–15.5)
WBC: 4 10*3/uL (ref 4.0–10.5)

## 2014-07-08 LAB — BASIC METABOLIC PANEL
Anion gap: 12 (ref 5–15)
BUN: 14 mg/dL (ref 6–23)
CHLORIDE: 101 meq/L (ref 96–112)
CO2: 30 mEq/L (ref 19–32)
CREATININE: 0.59 mg/dL (ref 0.50–1.10)
Calcium: 9.3 mg/dL (ref 8.4–10.5)
GFR calc Af Amer: >90 mL/min (ref >90)
GFR calc non Af Amer: >90 mL/min (ref >90)
GLUCOSE: 97 mg/dL (ref 70–99)
POTASSIUM: 3.5 meq/L — AB (ref 3.7–5.3)
Sodium: 143 mEq/L (ref 137–147)

## 2014-07-08 LAB — TYPE AND SCREEN
ABO/RH(D): A POS
Antibody Screen: NEGATIVE

## 2014-07-08 LAB — SURGICAL PCR SCREEN
MRSA, PCR: NEGATIVE
Staphylococcus aureus: NEGATIVE

## 2014-07-08 LAB — ABO/RH: ABO/RH(D): A POS

## 2014-07-08 NOTE — Pre-Procedure Instructions (Signed)
Carla Frank  07/08/2014   Your procedure is scheduled on:  Monday, July 27  Report to [facility] Admitting at 0530 AM.  Call this number if you have problems the morning of surgery: [phone]   Remember:   Do not eat food or drink liquids after midnight.Sunday night   Take these medicines the morning of surgery with A SIP OF WATER: gabapentin(Neurontin),ranitidine (Zantac), Flexeril if needed   Do not wear jewelry, make-up or nail polish.  Do not wear lotions, powders, or perfumes. You may NOT wear deodorant.  Do not shave 48 hours prior to surgery.  .  Do not bring valuables to the hospital.  [facility] is not responsible                  for any belongings or valuables.               Contacts, dentures or bridgework may not be worn into surgery.  Leave suitcase in the car. After surgery it may be brought to your room.  For patients admitted to the hospital, discharge time is determined by your                treatment team.      Special Instructions: [facility] - Preparing for Surgery  Before surgery, you can play an important role.  Because skin is not sterile, your skin needs to be as free of germs as possible.  You can reduce the number of germs on you skin by washing with CHG (chlorahexidine gluconate) soap before surgery.  CHG is an antiseptic cleaner which kills germs and bonds with the skin to continue killing germs even after washing.  Please DO NOT use if you have an allergy to CHG or antibacterial soaps.  If your skin becomes reddened/irritated stop using the CHG and inform your nurse when you arrive at Short Stay.  Do not shave (including legs and underarms) for at least 48 hours prior to the first CHG shower.  You may shave your face.  Please follow these instructions carefully:   1.  Shower with CHG Soap the night before surgery and the    morning of Surgery.  2.  If you choose to wash your hair, wash your hair first as usual with your  normal  shampoo.  3.  After you shampoo, rinse your hair and body thoroughly to remove the   Shampoo.  4.  Use CHG as you would any other liquid soap.  You can apply chg directly   to the skin and wash gently with scrungie or a clean washcloth.  5.  Apply the CHG Soap to your body ONLY FROM THE NECK DOWN.    Do not use on open wounds or open sores.  Avoid contact with your eyes,   ears, mouth and genitals (private parts).  Wash genitals (private parts)   with your normal soap.  6.  Wash thoroughly, paying special attention to the area where your surgery   will be performed.  7.  Thoroughly rinse your body with warm water from the neck down.  8.  DO NOT shower/wash with your normal soap after using and rinsing off   the CHG Soap.  9.  Pat yourself dry with a clean towel.            10.  Wear clean pajamas.            11 .  Place clean sheets on your  bed the night of your first shower and do not    sleep with pets.  Day of Surgery  Do not apply any lotions/deoderants the morning of surgery.  Please wear clean clothes to the hospital/surgery center.     Please read over the following fact sheets that you were given: Pain Booklet, Coughing and Deep Breathing, Blood Transfusion Information and Surgical Site Infection Prevention

## 2014-07-09 NOTE — Progress Notes (Signed)
Anesthesia Chart Review:  Patient is a 53 year old female scheduled for L3-4 PLIF on 07/20/14 by Dr. Jordan LikesPool.  History includes non-smoker, HTN, GERD, lumbar DDD, chronic UTI (on daily nitrofurantoin), hysterectomy. BMI is 31.39 consistent with mild obesity.  PCP is listed as Dr. Deatra JamesVyvyan Sun.  EKG on 07/08/14 showed NSR, LAD, right BBB.  No significant change since tracing on 07/11/13. No CV symptoms documented at PAT. No reported history of CAD, CHF, MI, or DM.   CXR on 07/08/14 showed: No active cardiopulmonary disease.  Preoperative labs noted.   Her EKG appears stable.  Further evaluation by her assigned anesthesiologist on the day of surgery, and if no acute changes then I would anticipate that she could proceed as planned.  Carla Ochsllison Elson Ulbrich, PA-C [facility]Anesthesiology Phone [phone] 07/09/2014 9:45 AM

## 2014-07-20 ENCOUNTER — Inpatient Hospital Stay (HOSPITAL_COMMUNITY)
Admission: RE | Admit: 2014-07-20 | Discharge: 2014-07-21 | DRG: 460 | Disposition: A | Discharge status: Home / Self Care | Payer: BC Managed Care – PPO | Source: Ambulatory Visit | Attending: Neurosurgery | Admitting: Neurosurgery

## 2014-07-20 ENCOUNTER — Inpatient Hospital Stay (HOSPITAL_COMMUNITY): Payer: BC Managed Care – PPO | Admitting: Anesthesiology

## 2014-07-20 ENCOUNTER — Inpatient Hospital Stay (HOSPITAL_COMMUNITY): Payer: BC Managed Care – PPO

## 2014-07-20 ENCOUNTER — Encounter (HOSPITAL_COMMUNITY): Payer: BC Managed Care – PPO | Admitting: Vascular Surgery

## 2014-07-20 ENCOUNTER — Encounter (HOSPITAL_COMMUNITY)
Admission: RE | Disposition: A | Discharge status: Home / Self Care | Payer: Self-pay | Source: Ambulatory Visit | Attending: Neurosurgery

## 2014-07-20 ENCOUNTER — Encounter (HOSPITAL_COMMUNITY): Payer: Self-pay | Admitting: Anesthesiology

## 2014-07-20 DIAGNOSIS — K5900 Constipation, unspecified: Secondary | ICD-10-CM | POA: Diagnosis present

## 2014-07-20 DIAGNOSIS — I252 Old myocardial infarction: Secondary | ICD-10-CM

## 2014-07-20 DIAGNOSIS — Z79899 Other long term (current) drug therapy: Secondary | ICD-10-CM

## 2014-07-20 DIAGNOSIS — M4310 Spondylolisthesis, site unspecified: Principal | ICD-10-CM | POA: Diagnosis present

## 2014-07-20 DIAGNOSIS — K219 Gastro-esophageal reflux disease without esophagitis: Secondary | ICD-10-CM | POA: Diagnosis present

## 2014-07-20 DIAGNOSIS — I10 Essential (primary) hypertension: Secondary | ICD-10-CM | POA: Diagnosis present

## 2014-07-20 SURGERY — POSTERIOR LUMBAR FUSION 1 LEVEL
Anesthesia: General | Site: Back

## 2014-07-20 MED ORDER — GLYCOPYRROLATE 0.2 MG/ML IJ SOLN
INTRAMUSCULAR | Status: DC | PRN
  Administered 2014-07-20: 0.4 mg via INTRAVENOUS

## 2014-07-20 MED ORDER — NEOSTIGMINE METHYLSULFATE 10 MG/10ML IV SOLN
INTRAVENOUS | Status: DC | PRN
  Administered 2014-07-20: 3 mg via INTRAVENOUS

## 2014-07-20 MED ORDER — PHENYLEPHRINE 40 MCG/ML (10ML) SYRINGE FOR IV PUSH (FOR BLOOD PRESSURE SUPPORT)
PREFILLED_SYRINGE | INTRAVENOUS | Status: AC
  Filled 2014-07-20: qty 10

## 2014-07-20 MED ORDER — LACTATED RINGERS IV SOLN
INTRAVENOUS | Status: DC | PRN
  Administered 2014-07-20 (×3): via INTRAVENOUS

## 2014-07-20 MED ORDER — PROPOFOL 10 MG/ML IV BOLUS
INTRAVENOUS | Status: DC | PRN
  Administered 2014-07-20: 150 mg via INTRAVENOUS

## 2014-07-20 MED ORDER — STERILE WATER FOR INJECTION IJ SOLN
INTRAMUSCULAR | Status: AC
  Filled 2014-07-20: qty 10

## 2014-07-20 MED ORDER — DIAZEPAM 5 MG PO TABS: 5.0000 mg | ORAL_TABLET | Freq: Four times a day (QID) | ORAL | Status: DC | PRN

## 2014-07-20 MED ORDER — HYDROCODONE-ACETAMINOPHEN 5-325 MG PO TABS: 1.0000 | ORAL_TABLET | ORAL | Status: DC | PRN

## 2014-07-20 MED ORDER — PROPOFOL 10 MG/ML IV BOLUS
INTRAVENOUS | Status: AC
  Filled 2014-07-20: qty 20

## 2014-07-20 MED ORDER — ARTIFICIAL TEARS OP OINT
TOPICAL_OINTMENT | OPHTHALMIC | Status: DC | PRN
  Administered 2014-07-20: 1 via OPHTHALMIC

## 2014-07-20 MED ORDER — GABAPENTIN 100 MG PO CAPS
100.0000 mg | ORAL_CAPSULE | Freq: Three times a day (TID) | ORAL | Status: DC
  Administered 2014-07-20 – 2014-07-21 (×4): 100 mg via ORAL
  Filled 2014-07-20 (×6): qty 1

## 2014-07-20 MED ORDER — HYDROCHLOROTHIAZIDE 25 MG PO TABS
25.0000 mg | ORAL_TABLET | Freq: Every day | ORAL | Status: DC
  Filled 2014-07-20 (×2): qty 1

## 2014-07-20 MED ORDER — MIDAZOLAM HCL 5 MG/5ML IJ SOLN
INTRAMUSCULAR | Status: DC | PRN
  Administered 2014-07-20: 2 mg via INTRAVENOUS

## 2014-07-20 MED ORDER — CEFAZOLIN SODIUM-DEXTROSE 2-3 GM-% IV SOLR
INTRAVENOUS | Status: AC
  Filled 2014-07-20: qty 50

## 2014-07-20 MED ORDER — NITROFURANTOIN MACROCRYSTAL 100 MG PO CAPS
100.0000 mg | ORAL_CAPSULE | Freq: Every day | ORAL | Status: DC
  Administered 2014-07-20 – 2014-07-21 (×2): 100 mg via ORAL
  Filled 2014-07-20 (×2): qty 1

## 2014-07-20 MED ORDER — ROCURONIUM BROMIDE 100 MG/10ML IV SOLN
INTRAVENOUS | Status: DC | PRN
  Administered 2014-07-20: 50 mg via INTRAVENOUS

## 2014-07-20 MED ORDER — FENTANYL CITRATE 0.05 MG/ML IJ SOLN
INTRAMUSCULAR | Status: DC | PRN
  Administered 2014-07-20: 150 ug via INTRAVENOUS

## 2014-07-20 MED ORDER — OXYCODONE HCL 5 MG/5ML PO SOLN: 5.0000 mg | Freq: Once | ORAL | Status: DC | PRN

## 2014-07-20 MED ORDER — SODIUM CHLORIDE 0.9 % IJ SOLN
INTRAMUSCULAR | Status: AC
  Filled 2014-07-20: qty 10

## 2014-07-20 MED ORDER — BACITRACIN 50000 UNITS IM SOLR
INTRAMUSCULAR | Status: DC | PRN
  Administered 2014-07-20: 09:00:00

## 2014-07-20 MED ORDER — THROMBIN 20000 UNITS EX SOLR
CUTANEOUS | Status: DC | PRN
  Administered 2014-07-20: 09:00:00 via TOPICAL

## 2014-07-20 MED ORDER — DEXAMETHASONE SODIUM PHOSPHATE 10 MG/ML IJ SOLN
INTRAMUSCULAR | Status: DC | PRN
  Administered 2014-07-20: 10 mg via INTRAVENOUS

## 2014-07-20 MED ORDER — LIDOCAINE HCL (CARDIAC) 20 MG/ML IV SOLN
INTRAVENOUS | Status: DC | PRN
  Administered 2014-07-20: 80 mg via INTRAVENOUS

## 2014-07-20 MED ORDER — ACETAMINOPHEN 325 MG PO TABS: 650.0000 mg | ORAL_TABLET | ORAL | Status: DC | PRN

## 2014-07-20 MED ORDER — OLMESARTAN-AMLODIPINE-HCTZ 40-10-25 MG PO TABS: 1.0000 | ORAL_TABLET | Freq: Every day | ORAL | Status: DC

## 2014-07-20 MED ORDER — ONDANSETRON HCL 4 MG/2ML IJ SOLN
INTRAMUSCULAR | Status: DC | PRN
  Administered 2014-07-20: 4 mg via INTRAVENOUS

## 2014-07-20 MED ORDER — ONDANSETRON HCL 4 MG/2ML IJ SOLN
4.0000 mg | INTRAMUSCULAR | Status: DC | PRN
  Administered 2014-07-20: 4 mg via INTRAVENOUS
  Filled 2014-07-20: qty 2

## 2014-07-20 MED ORDER — SODIUM CHLORIDE 0.9 % IV SOLN: 250.0000 mL | INTRAVENOUS | Status: DC

## 2014-07-20 MED ORDER — CYCLOBENZAPRINE HCL 10 MG PO TABS
10.0000 mg | ORAL_TABLET | Freq: Two times a day (BID) | ORAL | Status: DC | PRN
  Administered 2014-07-20 – 2014-07-21 (×2): 10 mg via ORAL
  Filled 2014-07-20 (×2): qty 1

## 2014-07-20 MED ORDER — IRBESARTAN 300 MG PO TABS
300.0000 mg | ORAL_TABLET | Freq: Every day | ORAL | Status: DC
  Filled 2014-07-20 (×2): qty 1

## 2014-07-20 MED ORDER — ACETAMINOPHEN 160 MG/5ML PO SOLN
325.0000 mg | ORAL | Status: DC | PRN
  Filled 2014-07-20: qty 20.3

## 2014-07-20 MED ORDER — 0.9 % SODIUM CHLORIDE (POUR BTL) OPTIME
TOPICAL | Status: DC | PRN
  Administered 2014-07-20: 1000 mL

## 2014-07-20 MED ORDER — SODIUM CHLORIDE 0.9 % IJ SOLN: 3.0000 mL | INTRAMUSCULAR | Status: DC | PRN

## 2014-07-20 MED ORDER — BUPIVACAINE HCL (PF) 0.25 % IJ SOLN
INTRAMUSCULAR | Status: DC | PRN
  Administered 2014-07-20: 20 mL

## 2014-07-20 MED ORDER — ROCURONIUM BROMIDE 50 MG/5ML IV SOLN
INTRAVENOUS | Status: AC
  Filled 2014-07-20: qty 1

## 2014-07-20 MED ORDER — CEFAZOLIN SODIUM 1-5 GM-% IV SOLN
1.0000 g | Freq: Three times a day (TID) | INTRAVENOUS | Status: AC
  Administered 2014-07-20 (×2): 1 g via INTRAVENOUS
  Filled 2014-07-20 (×2): qty 50

## 2014-07-20 MED ORDER — SUCCINYLCHOLINE CHLORIDE 20 MG/ML IJ SOLN
INTRAMUSCULAR | Status: AC
  Filled 2014-07-20: qty 1

## 2014-07-20 MED ORDER — DEXAMETHASONE SODIUM PHOSPHATE 4 MG/ML IJ SOLN
INTRAMUSCULAR | Status: AC
  Filled 2014-07-20: qty 3

## 2014-07-20 MED ORDER — GLYCOPYRROLATE 0.2 MG/ML IJ SOLN
INTRAMUSCULAR | Status: AC
  Filled 2014-07-20: qty 2

## 2014-07-20 MED ORDER — CEFAZOLIN SODIUM-DEXTROSE 2-3 GM-% IV SOLR
2.0000 g | Freq: Once | INTRAVENOUS | Status: AC
  Administered 2014-07-20: 2 g via INTRAVENOUS
  Filled 2014-07-20: qty 50

## 2014-07-20 MED ORDER — MENTHOL 3 MG MT LOZG: 1.0000 | LOZENGE | OROMUCOSAL | Status: DC | PRN

## 2014-07-20 MED ORDER — ONDANSETRON HCL 4 MG/2ML IJ SOLN: 4.0000 mg | Freq: Once | INTRAMUSCULAR | Status: DC | PRN

## 2014-07-20 MED ORDER — HYDROMORPHONE HCL PF 1 MG/ML IJ SOLN
INTRAMUSCULAR | Status: AC
  Filled 2014-07-20: qty 1

## 2014-07-20 MED ORDER — LIDOCAINE HCL (CARDIAC) 20 MG/ML IV SOLN
INTRAVENOUS | Status: AC
  Filled 2014-07-20: qty 5

## 2014-07-20 MED ORDER — OXYCODONE HCL 5 MG PO TABS: 5.0000 mg | ORAL_TABLET | Freq: Once | ORAL | Status: DC | PRN

## 2014-07-20 MED ORDER — POTASSIUM CHLORIDE CRYS ER 20 MEQ PO TBCR
20.0000 meq | EXTENDED_RELEASE_TABLET | Freq: Every day | ORAL | Status: DC
  Administered 2014-07-20 – 2014-07-21 (×2): 20 meq via ORAL
  Filled 2014-07-20 (×2): qty 1

## 2014-07-20 MED ORDER — HYDROMORPHONE HCL PF 1 MG/ML IJ SOLN
0.5000 mg | INTRAMUSCULAR | Status: DC | PRN
  Administered 2014-07-20: 1 mg via INTRAVENOUS
  Filled 2014-07-20: qty 1

## 2014-07-20 MED ORDER — VECURONIUM BROMIDE 10 MG IV SOLR
INTRAVENOUS | Status: AC
  Filled 2014-07-20: qty 10

## 2014-07-20 MED ORDER — NEOSTIGMINE METHYLSULFATE 10 MG/10ML IV SOLN
INTRAVENOUS | Status: AC
  Filled 2014-07-20: qty 1

## 2014-07-20 MED ORDER — AMLODIPINE BESYLATE 10 MG PO TABS
10.0000 mg | ORAL_TABLET | Freq: Every day | ORAL | Status: DC
  Filled 2014-07-20 (×2): qty 1

## 2014-07-20 MED ORDER — PHENOL 1.4 % MT LIQD: 1.0000 | OROMUCOSAL | Status: DC | PRN

## 2014-07-20 MED ORDER — FENTANYL CITRATE 0.05 MG/ML IJ SOLN
INTRAMUSCULAR | Status: AC
  Filled 2014-07-20: qty 5

## 2014-07-20 MED ORDER — SENNA 8.6 MG PO TABS
1.0000 | ORAL_TABLET | Freq: Two times a day (BID) | ORAL | Status: DC
Start: 2014-07-20 — End: 2014-07-21
  Administered 2014-07-20 – 2014-07-21 (×3): 8.6 mg via ORAL
  Filled 2014-07-20 (×3): qty 1

## 2014-07-20 MED ORDER — ALUM & MAG HYDROXIDE-SIMETH 200-200-20 MG/5ML PO SUSP: 30.0000 mL | Freq: Four times a day (QID) | ORAL | Status: DC | PRN

## 2014-07-20 MED ORDER — EPHEDRINE SULFATE 50 MG/ML IJ SOLN
INTRAMUSCULAR | Status: AC
  Filled 2014-07-20: qty 1

## 2014-07-20 MED ORDER — ONDANSETRON HCL 4 MG/2ML IJ SOLN
INTRAMUSCULAR | Status: AC
  Filled 2014-07-20: qty 2

## 2014-07-20 MED ORDER — ACETAMINOPHEN 650 MG RE SUPP: 650.0000 mg | RECTAL | Status: DC | PRN

## 2014-07-20 MED ORDER — ACETAMINOPHEN 325 MG PO TABS: 325.0000 mg | ORAL_TABLET | ORAL | Status: DC | PRN

## 2014-07-20 MED ORDER — SODIUM CHLORIDE 0.9 % IJ SOLN
3.0000 mL | Freq: Two times a day (BID) | INTRAMUSCULAR | Status: DC
  Administered 2014-07-20: 3 mL via INTRAVENOUS

## 2014-07-20 MED ORDER — KETOROLAC TROMETHAMINE 30 MG/ML IJ SOLN: 15.0000 mg | Freq: Once | INTRAMUSCULAR | Status: DC | PRN

## 2014-07-20 MED ORDER — GLYCOPYRROLATE 0.2 MG/ML IJ SOLN
INTRAMUSCULAR | Status: AC
  Filled 2014-07-20: qty 1

## 2014-07-20 MED ORDER — HYDROMORPHONE HCL PF 1 MG/ML IJ SOLN
0.2500 mg | INTRAMUSCULAR | Status: DC | PRN
  Administered 2014-07-20: 0.5 mg via INTRAVENOUS

## 2014-07-20 MED ORDER — MIDAZOLAM HCL 2 MG/2ML IJ SOLN
INTRAMUSCULAR | Status: AC
  Filled 2014-07-20: qty 2

## 2014-07-20 MED ORDER — DOCUSATE SODIUM 100 MG PO CAPS
200.0000 mg | ORAL_CAPSULE | Freq: Every day | ORAL | Status: DC | PRN
  Filled 2014-07-20: qty 2

## 2014-07-20 MED ORDER — OXYCODONE-ACETAMINOPHEN 5-325 MG PO TABS
1.0000 | ORAL_TABLET | ORAL | Status: DC | PRN
  Administered 2014-07-20: 1 via ORAL
  Administered 2014-07-20 – 2014-07-21 (×4): 2 via ORAL
  Filled 2014-07-20: qty 1
  Filled 2014-07-20 (×4): qty 2

## 2014-07-20 SURGICAL SUPPLY — 74 items
ADH SKN CLS APL DERMABOND .7 (GAUZE/BANDAGES/DRESSINGS) ×1
ADH SKN CLS LQ APL DERMABOND (GAUZE/BANDAGES/DRESSINGS) ×1
APL SKNCLS STERI-STRIP NONHPOA (GAUZE/BANDAGES/DRESSINGS) ×1
BAG DECANTER FOR FLEXI CONT (MISCELLANEOUS) ×2 IMPLANT
BENZOIN TINCTURE PRP APPL 2/3 (GAUZE/BANDAGES/DRESSINGS) ×2 IMPLANT
BLADE 10 SAFETY STRL DISP (BLADE) ×2 IMPLANT
BLADE SURG ROTATE 9660 (MISCELLANEOUS) IMPLANT
BRUSH SCRUB EZ PLAIN DRY (MISCELLANEOUS) ×2 IMPLANT
BUR MATCHSTICK NEURO 3.0 LAGG (BURR) ×2 IMPLANT
CAGE CAPSTONE BULLET 8X22 (Cage) ×1 IMPLANT
CANISTER SUCT 3000ML (MISCELLANEOUS) ×2 IMPLANT
CAP LCK SPNE (Orthopedic Implant) ×4 IMPLANT
CAP LOCK SPINE RADIUS (Orthopedic Implant) IMPLANT
CAP LOCKING (Orthopedic Implant) ×8 IMPLANT
CONT SPEC 4OZ CLIKSEAL STRL BL (MISCELLANEOUS) ×4 IMPLANT
COVER BACK TABLE 24X17X13 BIG (DRAPES) IMPLANT
COVER TABLE BACK 60X90 (DRAPES) ×2 IMPLANT
DECANTER SPIKE VIAL GLASS SM (MISCELLANEOUS) ×2 IMPLANT
DERMABOND ADHESIVE PROPEN (GAUZE/BANDAGES/DRESSINGS) ×1
DERMABOND ADVANCED (GAUZE/BANDAGES/DRESSINGS) ×1
DERMABOND ADVANCED .7 DNX12 (GAUZE/BANDAGES/DRESSINGS) ×1 IMPLANT
DERMABOND ADVANCED .7 DNX6 (GAUZE/BANDAGES/DRESSINGS) IMPLANT
DRAPE C-ARM 42X72 X-RAY (DRAPES) ×4 IMPLANT
DRAPE LAPAROTOMY 100X72X124 (DRAPES) ×2 IMPLANT
DRAPE POUCH INSTRU U-SHP 10X18 (DRAPES) ×2 IMPLANT
DRAPE PROXIMA HALF (DRAPES) IMPLANT
DRAPE SURG 17X23 STRL (DRAPES) ×8 IMPLANT
DURAPREP 26ML APPLICATOR (WOUND CARE) ×2 IMPLANT
ELECT REM PT RETURN 9FT ADLT (ELECTROSURGICAL) ×2
ELECTRODE REM PT RTRN 9FT ADLT (ELECTROSURGICAL) ×1 IMPLANT
EVACUATOR 1/8 PVC DRAIN (DRAIN) ×2 IMPLANT
GAUZE SPONGE 4X4 16PLY XRAY LF (GAUZE/BANDAGES/DRESSINGS) ×1 IMPLANT
GLOVE BIO SURGEON STRL SZ8 (GLOVE) ×1 IMPLANT
GLOVE BIOGEL PI IND STRL 7.0 (GLOVE) IMPLANT
GLOVE BIOGEL PI IND STRL 7.5 (GLOVE) IMPLANT
GLOVE BIOGEL PI INDICATOR 7.0 (GLOVE) ×3
GLOVE BIOGEL PI INDICATOR 7.5 (GLOVE) ×3
GLOVE ECLIPSE 8.5 STRL (GLOVE) ×1 IMPLANT
GLOVE ECLIPSE 9.0 STRL (GLOVE) ×4 IMPLANT
GLOVE EXAM NITRILE LRG STRL (GLOVE) IMPLANT
GLOVE EXAM NITRILE MD LF STRL (GLOVE) IMPLANT
GLOVE EXAM NITRILE XL STR (GLOVE) IMPLANT
GLOVE EXAM NITRILE XS STR PU (GLOVE) IMPLANT
GLOVE INDICATOR 8.5 STRL (GLOVE) ×1 IMPLANT
GLOVE SS BIOGEL STRL SZ 6.5 (GLOVE) IMPLANT
GLOVE SUPERSENSE BIOGEL SZ 6.5 (GLOVE) ×3
GLOVE SURG SS PI 7.0 STRL IVOR (GLOVE) ×3 IMPLANT
GOWN STRL REUS W/ TWL LRG LVL3 (GOWN DISPOSABLE) IMPLANT
GOWN STRL REUS W/ TWL XL LVL3 (GOWN DISPOSABLE) ×2 IMPLANT
GOWN STRL REUS W/TWL 2XL LVL3 (GOWN DISPOSABLE) IMPLANT
GOWN STRL REUS W/TWL LRG LVL3 (GOWN DISPOSABLE) ×2
GOWN STRL REUS W/TWL XL LVL3 (GOWN DISPOSABLE) ×10
KIT BASIN OR (CUSTOM PROCEDURE TRAY) ×2 IMPLANT
KIT ROOM TURNOVER OR (KITS) ×2 IMPLANT
MILL MEDIUM DISP (BLADE) ×1 IMPLANT
NEEDLE HYPO 22GX1.5 SAFETY (NEEDLE) ×2 IMPLANT
NS IRRIG 1000ML POUR BTL (IV SOLUTION) ×2 IMPLANT
PACK LAMINECTOMY NEURO (CUSTOM PROCEDURE TRAY) ×2 IMPLANT
ROD RADIUS 40MM (Neuro Prosthesis/Implant) ×4 IMPLANT
ROD SPNL 40X5.5XNS TI RDS (Neuro Prosthesis/Implant) IMPLANT
SCREW 5.75X40M (Screw) ×4 IMPLANT
SPONGE GAUZE 4X4 12PLY (GAUZE/BANDAGES/DRESSINGS) ×2 IMPLANT
SPONGE SURGIFOAM ABS GEL 100 (HEMOSTASIS) ×2 IMPLANT
STRIP CLOSURE SKIN 1/2X4 (GAUZE/BANDAGES/DRESSINGS) ×3 IMPLANT
SUT VIC AB 0 CT1 18XCR BRD8 (SUTURE) ×2 IMPLANT
SUT VIC AB 0 CT1 8-18 (SUTURE) ×4
SUT VIC AB 2-0 CT1 18 (SUTURE) ×2 IMPLANT
SUT VIC AB 3-0 SH 8-18 (SUTURE) ×4 IMPLANT
SYR 20ML ECCENTRIC (SYRINGE) ×2 IMPLANT
TOWEL OR 17X24 6PK STRL BLUE (TOWEL DISPOSABLE) ×2 IMPLANT
TOWEL OR 17X26 10 PK STRL BLUE (TOWEL DISPOSABLE) ×2 IMPLANT
TRAY FOLEY CATH 14FRSI W/METER (CATHETERS) ×2 IMPLANT
WATER STERILE IRR 1000ML POUR (IV SOLUTION) ×2 IMPLANT
WEDGE TANGENT 8X26MM ×1 IMPLANT

## 2014-07-20 NOTE — Anesthesia Procedure Notes (Signed)
Procedure Name: Intubation Date/Time: 07/20/2014 8:25 AM Performed by: Lovie CholOCK, Aeliana Spates K Pre-anesthesia Checklist: Patient identified, Emergency Drugs available, Suction available, Patient being monitored and Timeout performed Patient Re-evaluated:Patient Re-evaluated prior to inductionOxygen Delivery Method: Circle system utilized Preoxygenation: Pre-oxygenation with 100% oxygen Intubation Type: IV induction Ventilation: Mask ventilation without difficulty Laryngoscope Size: Miller and 2 Grade View: Grade I Tube type: Oral Tube size: 7.0 mm Number of attempts: 1 Airway Equipment and Method: Stylet Placement Confirmation: ETT inserted through vocal cords under direct vision,  positive ETCO2,  CO2 detector and breath sounds checked- equal and bilateral Secured at: 21 cm Tube secured with: Tape Dental Injury: Teeth and Oropharynx as per pre-operative assessment

## 2014-07-20 NOTE — Anesthesia Postprocedure Evaluation (Signed)
  Anesthesia Post-op Note  Patient: Carla Frank  Procedure(s) Performed: Procedure(s): POSTERIOR LUMBAR FUSION 1 LEVEL lumbar three/four (N/A)  Patient Location: PACU  Anesthesia Type:General  Level of Consciousness: awake  Airway and Oxygen Therapy: Patient Spontanous Breathing  Post-op Pain: mild  Post-op Assessment: Post-op Vital signs reviewed, Patient's Cardiovascular Status Stable, Respiratory Function Stable, Patent Airway, No signs of Nausea or vomiting and Pain level controlled  Post-op Vital Signs: Reviewed and stable  Last Vitals:  Filed Vitals:   07/20/14 1128  BP: 104/70  Pulse: 86  Temp: 37 C  Resp: 20    Complications: No apparent anesthesia complications

## 2014-07-20 NOTE — Transfer of Care (Signed)
Immediate Anesthesia Transfer of Care Note  Patient: Carla Frank  Procedure(s) Performed: Procedure(s): POSTERIOR LUMBAR FUSION 1 LEVEL lumbar three/four (N/A)  Patient Location: PACU  Anesthesia Type:General  Level of Consciousness: oriented, sedated and patient cooperative  Airway & Oxygen Therapy: Patient Spontanous Breathing and Patient connected to nasal cannula oxygen  Post-op Assessment: Report given to PACU RN and Post -op Vital signs reviewed and stable  Post vital signs: Reviewed  Complications: No apparent anesthesia complications

## 2014-07-20 NOTE — Anesthesia Preprocedure Evaluation (Addendum)
Anesthesia Evaluation  Patient identified by MRN, date of birth, ID band Patient awake    Reviewed: Allergy & Precautions, H&P , NPO status , Patient's Chart, lab work & pertinent test results  History of Anesthesia Complications Negative for: history of anesthetic complications  Airway Mallampati: I TM Distance: >3 FB Neck ROM: Full    Dental  (+) Partial Upper, Dental Advisory Given,    Pulmonary neg pulmonary ROS,  breath sounds clear to auscultation        Cardiovascular hypertension, Pt. on medications - angina- CAD, - Past MI, - CHF and - DOE - dysrhythmias Rhythm:Regular     Neuro/Psych Low back pain with tingling down both thighs negative psych ROS   GI/Hepatic Neg liver ROS, GERD-  Medicated and Controlled,  Endo/Other  neg diabetesMorbid obesity  Renal/GU negative Renal ROS     Musculoskeletal   Abdominal   Peds  Hematology negative hematology ROS (+)   Anesthesia Other Findings   Reproductive/Obstetrics                          Anesthesia Physical Anesthesia Plan  ASA: II  Anesthesia Plan: General   Post-op Pain Management:    Induction: Intravenous  Airway Management Planned: Oral ETT  Additional Equipment: None  Intra-op Plan:   Post-operative Plan: Extubation in OR  Informed Consent: I have reviewed the patients History and Physical, chart, labs and discussed the procedure including the risks, benefits and alternatives for the proposed anesthesia with the patient or authorized representative who has indicated his/her understanding and acceptance.   Dental advisory given  Plan Discussed with: CRNA and Surgeon  Anesthesia Plan Comments:         Anesthesia Quick Evaluation

## 2014-07-20 NOTE — H&P (Signed)
Carla Frank is an 53 y.o. female.   Chief Complaint: Back and bilateral leg pain HPI: 53 year old female with progressive back pain with lower extremity symptoms consistent with neurogenic claudication has a L3-4 grade 1 degenerative spondylolisthesis with severe stenosis discovered on MRI scan. Patient's failed conservative management and presents now for decompression and fusion surgery.  Past Medical History  Diagnosis Date  . Hypertension   . URI (upper respiratory infection) 05/30/2014  . Chronic UTI     since bladder sling surgery  . Constipation   . GERD (gastroesophageal reflux disease)   . DDD (degenerative disc disease), lumbar     Past Surgical History  Procedure Laterality Date  . Abdominal hysterectomy    . Carpal tunnel release Bilateral   . Incontinence surgery      No family history on file. Social History:  reports that she has never smoked. She does not have any smokeless tobacco history on file. She reports that she drinks alcohol. She reports that she does not use illicit drugs.  Allergies: No Known Allergies  Medications Prior to Admission  Medication Sig Dispense Refill  . cyclobenzaprine (FLEXERIL) 10 MG tablet Take 10 mg by mouth 2 (two) times daily as needed for muscle spasms.      Marland Kitchen. docusate sodium (COLACE) 100 MG capsule Take 200 mg by mouth daily as needed for mild constipation.      . gabapentin (NEURONTIN) 100 MG capsule Take 100 mg by mouth 3 (three) times daily.      Marland Kitchen. ibuprofen (ADVIL,MOTRIN) 200 MG tablet Take 800 mg by mouth every 6 (six) hours as needed for headache or mild pain.      . Multiple Vitamin (MULTIVITAMIN WITH MINERALS) TABS Take 1 tablet by mouth daily.      . naproxen sodium (ANAPROX) 550 MG tablet Take 550 mg by mouth 2 (two) times daily as needed for moderate pain.      . nitrofurantoin (MACRODANTIN) 100 MG capsule Take 100 mg by mouth daily.       . Olmesartan-Amlodipine-HCTZ (TRIBENZOR) 40-10-25 MG TABS Take 1 tablet by  mouth daily with breakfast.      . potassium chloride SA (K-DUR,KLOR-CON) 20 MEQ tablet Take 20 mEq by mouth daily.      Marland Kitchen. PRESCRIPTION MEDICATION Apply 1 application topically daily. Compounded hormonal cream.      . ranitidine (ZANTAC) 150 MG tablet Take 150 mg by mouth daily as needed for heartburn.      Marland Kitchen. OVER THE COUNTER MEDICATION Take 1 tablet by mouth daily. Slim Trim U.        No results found for this or any previous visit (from the past 48 hour(s)). No results found.  Review of Systems  Constitutional: Negative.   HENT: Negative.   Eyes: Negative.   Respiratory: Negative.   Cardiovascular: Negative.   Gastrointestinal: Negative.   Genitourinary: Negative.   Musculoskeletal: Negative.   Skin: Negative.   Neurological: Negative.   Endo/Heme/Allergies: Negative.   Psychiatric/Behavioral: Negative.     Blood pressure 117/85, pulse 96, temperature 98.9 F (37.2 C), temperature source Oral, resp. rate 18, height 5\' 4"  (1.626 m), weight 82.555 kg (182 lb), SpO2 100.00%. Physical Exam  Constitutional: She is oriented to person, place, and time. She appears well-developed and well-nourished. No distress.  HENT:  Head: Normocephalic and atraumatic.  Right Ear: External ear normal.  Left Ear: External ear normal.  Nose: Nose normal.  Mouth/Throat: Oropharynx is clear and moist. No oropharyngeal exudate.  Eyes: Conjunctivae and EOM are normal. Pupils are equal, round, and reactive to light. Right eye exhibits no discharge. Left eye exhibits no discharge.  Neck: Normal range of motion. Neck supple. No tracheal deviation present. No thyromegaly present.  Cardiovascular: Normal rate, regular rhythm, normal heart sounds and intact distal pulses.  Exam reveals no friction rub.   No murmur heard. Respiratory: Effort normal and breath sounds normal. No respiratory distress. She has no wheezes.  GI: Soft. Bowel sounds are normal. She exhibits no distension. There is no tenderness.   Musculoskeletal: Normal range of motion. She exhibits no edema and no tenderness.  Neurological: She is alert and oriented to person, place, and time. She has normal reflexes. She displays normal reflexes. No cranial nerve deficit. She exhibits normal muscle tone.  Skin: Skin is warm and dry. No rash noted. She is not diaphoretic. No erythema.  Psychiatric: She has a normal mood and affect. Her behavior is normal. Judgment and thought content normal.     Assessment/Plan L3-4 grade 1 degenerative spondylolisthesis with stenosis and neurogenic claudication. Plan L3-4 decompressive laminectomy with foraminotomies followed by posterior lumbar interbody fusion utilizing interbody cage, structural allograft, local autograft, coupled with posterior lateral arthrodesis utilizing nonsegmental pedicle screw instrumentation. Risks and benefits have been explained. Patient wishes to proceed.  Carla Frank A 07/20/2014, 7:39 AM

## 2014-07-20 NOTE — Brief Op Note (Signed)
07/20/2014  9:51 AM  PATIENT:  Irene PapMiriam E Thompson  53 y.o. female  PRE-OPERATIVE DIAGNOSIS:  degenerative spondylolisthesis  POST-OPERATIVE DIAGNOSIS:  degenerative spondylolisthesis  PROCEDURE:  Procedure(s): POSTERIOR LUMBAR FUSION 1 LEVEL lumbar three/four (N/A)  SURGEON:  Surgeon(s) and Role:    * Temple PaciniHenry A Kassidy Frankson, MD - Primary  PHYSICIAN ASSISTANT:   ASSISTANTS:    ANESTHESIA:   general  EBL:  Total I/O In: 2000 [I.V.:2000] Out: 550 [Urine:350; Blood:200]  BLOOD ADMINISTERED:none  DRAINS: none   LOCAL MEDICATIONS USED:  MARCAINE     SPECIMEN:  No Specimen  DISPOSITION OF SPECIMEN:  N/A  COUNTS:  YES  TOURNIQUET:  * No tourniquets in log *  DICTATION: .Dragon Dictation  PLAN OF CARE: Admit to inpatient   PATIENT DISPOSITION:  PACU - hemodynamically stable.   Delay start of Pharmacological VTE agent (>24hrs) due to surgical blood loss or risk of bleeding: yes

## 2014-07-20 NOTE — Op Note (Signed)
Date of procedure: 07/20/2014  Date of dictation: Same  Service: Neurosurgery  Preoperative diagnosis: L3-4 grade 1 degenerative spondylolisthesis with stenosis and neurogenic claudication  Postoperative diagnosis: Same  Procedure Name: L3-4 decompressive laminectomy with bilateral L3 and L4 decompressive foraminotomies; more than would be required for simple interbody fusion alone.  L3-L4 posterior lumbar interbody fusion utilizing tangent interbody allograft wedge, Telamon interbody peek cage, locally harvested autograft.  L3-4 posterior lateral arthrodesis utilizing nonsegmental pedicle screw instrumentation and local autograft  Surgeon:Mecca Barga A.Jackie Russman, M.D.  Asst. Surgeon: None  Anesthesia: General  Indication: 53 year old female with severe back and bilateral lower extremity symptoms consistent with neurogenic claudication. Workup demonstrates evidence of a grade 1 L3-4 degenerative spondylolisthesis with severe stenosis. Patient presents now for decompression and fusion.  Operative note: After induction of anesthesia, patient positioned prone onto Wilson frame and appropriately padded. Lumbar region prepped and draped. Incision made overlying L3-4. Subperiosteal dissection performed bilaterally exposing the lamina and facet joints of L3 and L4 bilaterally as well as the transverse processes of L3 and L4 bilaterally. Retractor placed. Fluoroscopy used. Level confirmed. Decompressive laminectomy and foraminotomies performed using Leksell rongeurs Kerrison rongeurs the high-speed drill to remove the entire lamina of L3 entire inferior facet of L3 bilaterally superior facet of L4 bilaterally and the super M. of the L4 lamina bilaterally. Ligament flavum was elevated and resected in a piecemeal fashion. Wide decompressive foraminotomies were performed on of course exiting L3 and L4 nerve roots bilaterally. Epidural venous plexus was coagulated and cut. Bilateral discectomies were performed at  L3-4. Disc space was distracted up to 9 mm with a 9 mm distractor left in the patient's left side. Thecal sac and nerve roots protected on the right side. The space and reamed and then cut with 8 mm tangent instruments. Soft tissue was removed and interspace. 8 mm x 23 mm Telamon cage packed with morselized autograft was impacted into place and recessed approximately 1 mm from the posterior cortical margin of L3. Distractor was removed and patient's left side. Thecal sac and nerve roots were protected on the left side. The space was once again reamed and cut with 8 mm tangent instruments. Soft tissue was removed from the interspace. Disc space was further curettaged. Morselized autograft was packed into the interspace for later fusion. 8 x 26 millimeter tangent wedges and packed into place recessed roughly 1 mm from the posterior cortical margin of L3. Pedicles of L3 and L4 were notified using surface landmarks and intraoperative fluoroscopy. Superficial bone was removed overlying each pedicle. Each pedicle was then probed using a pedicle awl. Each pedicle awl track was tapped with a 5.250 m screw tap. Each her tap hole was probed and found to be solidly within the bone. 5.75 x 40 mm radius brain screws from [employer] placed bilaterally. Transverse processes of L3 and L4 decorticated. Morselize autograft packed posterior laterally for later fusion. Short segment titanium rod placed over the screw heads at L3 and L4. Locking caps placed over the screws. Locking caps engaged with the construct under compression. Final images revealed good position the bone graft and hardware at the proper upper level with normal alignment of the spine. Wound is irrigated one final time. Hemostasis was assured. Wound closed in a typical fashion. Steri-Strips and sterile dressing applied. There were no apparent complications. Patient tolerated procedure well. She returns to the recovery room postop.

## 2014-07-21 MED ORDER — DIPHENHYDRAMINE HCL 25 MG PO CAPS
25.0000 mg | ORAL_CAPSULE | ORAL | Status: DC | PRN
  Administered 2014-07-21: 25 mg via ORAL

## 2014-07-21 MED ORDER — DIAZEPAM 5 MG PO TABS: 5.0000 mg | ORAL_TABLET | Freq: Four times a day (QID) | ORAL | Status: DC | PRN

## 2014-07-21 MED ORDER — OXYCODONE-ACETAMINOPHEN 5-325 MG PO TABS: 1.0000 | ORAL_TABLET | ORAL | Status: DC | PRN

## 2014-07-21 MED FILL — Sodium Chloride IV Soln 0.9%: INTRAVENOUS | Qty: 1000 | Status: AC

## 2014-07-21 MED FILL — Heparin Sodium (Porcine) Inj 1000 Unit/ML: INTRAMUSCULAR | Qty: 30 | Status: AC

## 2014-07-21 NOTE — Discharge Summary (Signed)
Physician Discharge Summary  Patient ID: Carla Frank MRN: [id number] DOB/AGE: 55/06/1961 55 y.o.  Admit date: 07/20/2014 Discharge date: 07/21/2014  Admission Diagnoses:  Discharge Diagnoses:  Principal Problem:   Degenerative spondylolisthesis   Discharged Condition: good  Hospital Course: The patient was admitted to the hospital where she underwent an uncomplicated L4-5 decompression and fusion. Postoperatively she is done very well. Preoperative back and leg pain much improved. She's up and plating without difficulty and is ready for discharge home.  Consults:   Significant Diagnostic Studies:   Treatments:   Discharge Exam: Blood pressure 103/68, pulse 87, temperature 99.2 F (37.3 C), temperature source Oral, resp. rate 18, height 5\' 4"  (1.626 m), weight 82.555 kg (182 lb), SpO2 97.00%. Awake and alert. Oriented and appropriate. Cranial nerve function intact to motor and sensory function extremities normal. Wound clean and dry. Chest and abdomen benign.  Disposition: 01-Home or Self Care     Medication List         cyclobenzaprine 10 MG tablet  Commonly known as:  FLEXERIL  Take 10 mg by mouth 2 (two) times daily as needed for muscle spasms.     diazepam 5 MG tablet  Commonly known as:  VALIUM  Take 1-2 tablets (5-10 mg total) by mouth every 6 (six) hours as needed for muscle spasms.     docusate sodium 100 MG capsule  Commonly known as:  COLACE  Take 200 mg by mouth daily as needed for mild constipation.     gabapentin 100 MG capsule  Commonly known as:  NEURONTIN  Take 100 mg by mouth 3 (three) times daily.     ibuprofen 200 MG tablet  Commonly known as:  ADVIL,MOTRIN  Take 800 mg by mouth every 6 (six) hours as needed for headache or mild pain.     multivitamin with minerals Tabs tablet  Take 1 tablet by mouth daily.     naproxen sodium 550 MG tablet  Commonly known as:  ANAPROX  Take 550 mg by mouth 2 (two) times daily as needed for moderate  pain.     nitrofurantoin 100 MG capsule  Commonly known as:  MACRODANTIN  Take 100 mg by mouth daily.     OVER THE COUNTER MEDICATION  Take 1 tablet by mouth daily. Slim Trim U.     oxyCODONE-acetaminophen 5-325 MG per tablet  Commonly known as:  PERCOCET/ROXICET  Take 1-2 tablets by mouth every 4 (four) hours as needed for moderate pain.     potassium chloride SA 20 MEQ tablet  Commonly known as:  K-DUR,KLOR-CON  Take 20 mEq by mouth daily.     PRESCRIPTION MEDICATION  Apply 1 application topically daily. Compounded hormonal cream.     ranitidine 150 MG tablet  Commonly known as:  ZANTAC  Take 150 mg by mouth daily as needed for heartburn.     TRIBENZOR 40-10-25 MG Tabs  Generic drug:  Olmesartan-Amlodipine-HCTZ  Take 1 tablet by mouth daily with breakfast.         Signed: Talli Kimmer A 07/21/2014, 10:45 AM

## 2014-07-21 NOTE — Progress Notes (Signed)
Pt given D/C instructions with Rx's, verbal understanding was given. Pt's IV was removed prior to D/C. Pt D/C'd home via wheelchair @ 1300 per MD order. Pt received corsett brace prior to D/C. Pt is stable @ D/C and has no other needs. Rema FendtAshley Zoriyah Scheidegger, RN

## 2014-07-21 NOTE — Progress Notes (Signed)
OT Cancellation Note  Patient Details Name: Carla Frank MRN: [id number] DOB: 04/26/1961   Cancelled Treatment:    Reason Eval/Treat Not Completed: OT screened. Spoke with pt and pt will have spouse to assist at home and no OT concerns at this time.   Earlie RavelingStraub, Maurico Perrell L OTR/L [phone] 07/21/2014, 10:58 AM

## 2014-07-21 NOTE — Discharge Instructions (Signed)
Wound Care °Keep incision covered and dry for two days.  If you shower, cover incision with plastic wrap.  °Do not put any creams, lotions, or ointments on incision. °Leave steri-strips on back.  They will fall off by themselves. °Activity °Walk each and every day, increasing distance each day. °No lifting greater than 5 lbs.  Avoid excessive neck motion. °No driving for 2 weeks; may ride as a passenger locally. °If provided with back brace, wear when out of bed.  It is not necessary to wear brace in bed. °Diet °Resume your normal diet.  °Return to Work °Will be discussed at you follow up appointment. °Call Your Doctor If Any of These Occur °Redness, drainage, or swelling at the wound.  °Temperature greater than 101 degrees. °Severe pain not relieved by pain medication. °Incision starts to come apart. °Follow Up Appt °Call today for appointment in 1-2 weeks ([phone]) or for problems.  If you have any hardware placed in your spine, you will need an x-ray before your appointment. ° ° ° °Wound Care °Keep incision covered and dry for one week.  If you shower prior to then, cover incision with plastic wrap.  °You may remove outer bandage after one week and shower.  °Do not put any creams, lotions, or ointments on incision. °Leave steri-strips on neck.  They will fall off by themselves. °Activity °Walk each and every day, increasing distance each day. °No lifting greater than 5 lbs.  Avoid excessive neck motion. °No driving for 2 weeks; may ride as a passenger locally. °If provided with back brace, wear when out of bed.  It is not necessary to wear brace in bed. °Diet °Resume your normal diet.  °Return to Work °Will be discussed at you follow up appointment. °Call Your Doctor If Any of These Occur °Redness, drainage, or swelling at the wound.  °Temperature greater than 101 degrees. °Severe pain not relieved by pain medication. °Incision starts to come apart. °Follow Up Appt °Call today for appointment in 1-2 weeks  ([phone]) or for problems.  If you have any hardware placed in your spine, you will need an x-ray before your appointment. °

## 2014-07-21 NOTE — Evaluation (Signed)
Physical Therapy Evaluation Patient Details Name: Carla PapMiriam E Frank MRN: [id number] DOB: 12/19/1961 Today's Date: 07/21/2014   History of Present Illness  Pt s/p L3-4 PLIF  Clinical Impression  Pt very pleasant and moving well with education for precautions with bed mobility, transfers, gait and activity modification as well as work Therapist, artenvironment ergonomic pointers. Pt and spouse educated for all above and verbalized understanding. Pt will benefit from acute therapy to maximize mobility adhering to precautions prior to D/C to return pt to PLOF.     Follow Up Recommendations No PT follow up    Equipment Recommendations  None recommended by PT    Recommendations for Other Services       Precautions / Restrictions Precautions Precautions: Back Precaution Booklet Issued: Yes (comment) Required Braces or Orthoses: Spinal Brace Spinal Brace: Lumbar corset;Applied in sitting position Restrictions Weight Bearing Restrictions: No      Mobility  Bed Mobility Overal bed mobility: Needs Assistance Bed Mobility: Rolling;Sidelying to Sit Rolling: Supervision Sidelying to sit: Supervision       General bed mobility comments: cues for sequence and precautions  Transfers Overall transfer level: Needs assistance   Transfers: Sit to/from Stand Sit to Stand: Supervision         General transfer comment: cues for posture and hand placement  Ambulation/Gait Ambulation/Gait assistance: Modified independent (Device/Increase time) Ambulation Distance (Feet): 200 Feet Assistive device: None Gait Pattern/deviations: Step-through pattern;Decreased stride length   Gait velocity interpretation: Below normal speed for age/gender General Gait Details: slow steady gait secondary to discomfort  Stairs Stairs: Yes Stairs assistance: Modified independent (Device/Increase time) Stair Management: One rail Left;Alternating pattern;Forwards Number of Stairs: 11 General stair comments: cues for  sequence of step to pattern if begins to experience any difficulty  Wheelchair Mobility    Modified Rankin (Stroke Patients Only)       Balance                                             Pertinent Vitals/Pain 3/10 incisional soreness    Home Living Family/patient expects to be discharged to:: Private residence Living Arrangements: Spouse/significant other Available Help at Discharge: Family;Available 24 hours/day Type of Home: House Home Access: Stairs to enter     Home Layout: Two level;Bed/bath upstairs Home Equipment: None      Prior Function Level of Independence: Independent         Comments: pt works as [employer] secretary     Hand Dominance        Extremity/Trunk Assessment   Upper Extremity Assessment: Overall WFL for tasks assessed           Lower Extremity Assessment: Overall WFL for tasks assessed      Cervical / Trunk Assessment: Normal  Communication   Communication: No difficulties  Cognition Arousal/Alertness: Awake/alert Behavior During Therapy: WFL for tasks assessed/performed Overall Cognitive Status: Within Functional Limits for tasks assessed                      General Comments      Exercises        Assessment/Plan    PT Assessment Patient needs continued PT services  PT Diagnosis Acute pain;Difficulty walking   PT Problem List Decreased knowledge of precautions;Pain;Decreased mobility  PT Treatment Interventions Gait training;Functional mobility training;Therapeutic activities;Patient/family education   PT Goals (Current goals can be  found in the Care Plan section) Acute Rehab PT Goals Patient Stated Goal: return to work, bowling and riding motorcycles PT Goal Formulation: With patient/family Time For Goal Achievement: 07/28/14 Potential to Achieve Goals: Good    Frequency Min 5X/week   Barriers to discharge        Co-evaluation               End of Session  Equipment Utilized During Treatment: Back brace Activity Tolerance: Patient tolerated treatment well Patient left: in chair;with call bell/phone within reach;with family/visitor present Nurse Communication: Mobility status;Precautions;Patient requests pain meds         Time: 1610-9604 PT Time Calculation (min): 18 min   Charges:   PT Evaluation $Initial PT Evaluation Tier I: 1 Procedure PT Treatments $Therapeutic Activity: 8-22 mins   PT G Codes:          Delorse Lek 07/21/2014, 10:03 AM Delaney Meigs, PT [phone]

## 2016-03-28 DIAGNOSIS — M542 Cervicalgia: Secondary | ICD-10-CM | POA: Diagnosis not present

## 2016-03-28 DIAGNOSIS — M4692 Unspecified inflammatory spondylopathy, cervical region: Secondary | ICD-10-CM | POA: Diagnosis not present

## 2016-06-26 DIAGNOSIS — N390 Urinary tract infection, site not specified: Secondary | ICD-10-CM | POA: Diagnosis not present

## 2016-06-26 DIAGNOSIS — I10 Essential (primary) hypertension: Secondary | ICD-10-CM | POA: Diagnosis not present

## 2016-06-26 DIAGNOSIS — M5030 Other cervical disc degeneration, unspecified cervical region: Secondary | ICD-10-CM | POA: Diagnosis not present

## 2016-07-24 DIAGNOSIS — N390 Urinary tract infection, site not specified: Secondary | ICD-10-CM | POA: Diagnosis not present

## 2016-08-24 DIAGNOSIS — B370 Candidal stomatitis: Secondary | ICD-10-CM | POA: Diagnosis not present

## 2016-08-26 ENCOUNTER — Encounter (HOSPITAL_COMMUNITY): Payer: Self-pay | Admitting: Emergency Medicine

## 2016-08-26 ENCOUNTER — Ambulatory Visit (HOSPITAL_COMMUNITY)
Admission: EM | Admit: 2016-08-26 | Discharge: 2016-08-26 | Disposition: A | Discharge status: Home / Self Care | Payer: BLUE CROSS/BLUE SHIELD | Attending: Physician Assistant | Admitting: Physician Assistant

## 2016-08-26 DIAGNOSIS — M7918 Myalgia, other site: Secondary | ICD-10-CM

## 2016-08-26 DIAGNOSIS — M791 Myalgia: Secondary | ICD-10-CM | POA: Diagnosis not present

## 2016-08-26 MED ORDER — CYCLOBENZAPRINE HCL 10 MG PO TABS: 10.0000 mg | ORAL_TABLET | Freq: Two times a day (BID) | ORAL | 0 refills | Status: DC | PRN

## 2016-08-26 MED ORDER — TRAMADOL HCL 50 MG PO TABS: 50.0000 mg | ORAL_TABLET | Freq: Four times a day (QID) | ORAL | 0 refills | Status: DC | PRN
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
## 2016-09-08 DIAGNOSIS — B370 Candidal stomatitis: Secondary | ICD-10-CM | POA: Diagnosis not present

## 2016-09-11 DIAGNOSIS — R319 Hematuria, unspecified: Secondary | ICD-10-CM | POA: Diagnosis not present

## 2016-09-11 DIAGNOSIS — N390 Urinary tract infection, site not specified: Secondary | ICD-10-CM | POA: Diagnosis not present

## 2016-10-03 DIAGNOSIS — Z01419 Encounter for gynecological examination (general) (routine) without abnormal findings: Secondary | ICD-10-CM | POA: Diagnosis not present

## 2016-10-03 DIAGNOSIS — Z6832 Body mass index (BMI) 32.0-32.9, adult: Secondary | ICD-10-CM | POA: Diagnosis not present

## 2016-10-03 DIAGNOSIS — Z1231 Encounter for screening mammogram for malignant neoplasm of breast: Secondary | ICD-10-CM | POA: Diagnosis not present

## 2016-10-11 DIAGNOSIS — Z1212 Encounter for screening for malignant neoplasm of rectum: Secondary | ICD-10-CM | POA: Diagnosis not present

## 2016-10-12 DIAGNOSIS — M5030 Other cervical disc degeneration, unspecified cervical region: Secondary | ICD-10-CM | POA: Diagnosis not present

## 2016-10-12 DIAGNOSIS — M542 Cervicalgia: Secondary | ICD-10-CM | POA: Diagnosis not present

## 2016-10-24 DIAGNOSIS — M4310 Spondylolisthesis, site unspecified: Secondary | ICD-10-CM | POA: Diagnosis not present

## 2016-11-02 ENCOUNTER — Other Ambulatory Visit: Payer: Self-pay | Admitting: Neurosurgery

## 2016-11-02 DIAGNOSIS — M4310 Spondylolisthesis, site unspecified: Secondary | ICD-10-CM

## 2016-11-07 DIAGNOSIS — R21 Rash and other nonspecific skin eruption: Secondary | ICD-10-CM | POA: Diagnosis not present

## 2016-11-13 ENCOUNTER — Other Ambulatory Visit: Payer: BLUE CROSS/BLUE SHIELD

## 2016-11-14 ENCOUNTER — Other Ambulatory Visit: Payer: Self-pay | Admitting: Neurosurgery

## 2016-11-14 DIAGNOSIS — N390 Urinary tract infection, site not specified: Secondary | ICD-10-CM | POA: Diagnosis not present

## 2017-01-01 DIAGNOSIS — Z6829 Body mass index (BMI) 29.0-29.9, adult: Secondary | ICD-10-CM | POA: Diagnosis not present

## 2017-01-01 DIAGNOSIS — E669 Obesity, unspecified: Secondary | ICD-10-CM | POA: Diagnosis not present

## 2017-01-13 DIAGNOSIS — R319 Hematuria, unspecified: Secondary | ICD-10-CM | POA: Diagnosis not present

## 2017-01-13 DIAGNOSIS — N390 Urinary tract infection, site not specified: Secondary | ICD-10-CM | POA: Diagnosis not present

## 2017-01-31 DIAGNOSIS — N390 Urinary tract infection, site not specified: Secondary | ICD-10-CM | POA: Diagnosis not present

## 2017-02-12 DIAGNOSIS — I10 Essential (primary) hypertension: Secondary | ICD-10-CM | POA: Diagnosis not present

## 2017-02-12 DIAGNOSIS — L309 Dermatitis, unspecified: Secondary | ICD-10-CM | POA: Diagnosis not present

## 2017-03-09 DIAGNOSIS — B370 Candidal stomatitis: Secondary | ICD-10-CM | POA: Diagnosis not present

## 2017-04-02 DIAGNOSIS — R3129 Other microscopic hematuria: Secondary | ICD-10-CM | POA: Diagnosis not present

## 2017-04-02 DIAGNOSIS — N390 Urinary tract infection, site not specified: Secondary | ICD-10-CM | POA: Diagnosis not present

## 2017-04-17 DIAGNOSIS — E669 Obesity, unspecified: Secondary | ICD-10-CM | POA: Diagnosis not present

## 2017-04-17 DIAGNOSIS — Z6829 Body mass index (BMI) 29.0-29.9, adult: Secondary | ICD-10-CM | POA: Diagnosis not present

## 2017-04-20 DIAGNOSIS — N390 Urinary tract infection, site not specified: Secondary | ICD-10-CM | POA: Diagnosis not present

## 2017-04-22 ENCOUNTER — Ambulatory Visit (HOSPITAL_COMMUNITY)
Admission: EM | Admit: 2017-04-22 | Discharge: 2017-04-22 | Disposition: A | Discharge status: Home / Self Care | Payer: BLUE CROSS/BLUE SHIELD | Attending: Internal Medicine | Admitting: Internal Medicine

## 2017-04-22 ENCOUNTER — Encounter (HOSPITAL_COMMUNITY): Payer: Self-pay | Admitting: Emergency Medicine

## 2017-04-22 DIAGNOSIS — M791 Myalgia: Secondary | ICD-10-CM | POA: Diagnosis not present

## 2017-04-22 DIAGNOSIS — M7918 Myalgia, other site: Secondary | ICD-10-CM

## 2017-04-22 MED ORDER — IBUPROFEN 800 MG PO TABS: 800.0000 mg | ORAL_TABLET | Freq: Three times a day (TID) | ORAL | 0 refills | Status: AC

## 2017-04-22 NOTE — ED Provider Notes (Signed)
CSN: [id number]     Arrival date & time 04/22/17  1423 History   First MD Initiated Contact with Patient 04/22/17 1711     Chief Complaint  Patient presents with  . Abdominal Pain   (Consider location/radiation/quality/duration/timing/severity/associated sxs/prior Treatment) 55 year old female presents to clinic for evaluation of right sided lower back and abdominal pain. This is been ongoing for 3-4 days. She is seen by urology, and is on long-term Keflex for past 3 months related to her urological issues, she was seen at the [facility] walk-in clinic Friday, was positive for hemoglobin, and leukocytes on her UA. States she is always positive for hemoglobin because of her conditions, the provider there switched her to [employer]. Pain has persisted, she has no nausea, diarrhea, fever, constipation, weakness, loss of appetite or other symptoms.   The history is provided by the patient.  Abdominal Pain  Associated symptoms: no chills, no diarrhea, no fever, no nausea and no vomiting     Past Medical History:  Diagnosis Date  . Chronic UTI    since bladder sling surgery  . Constipation   . DDD (degenerative disc disease), lumbar   . GERD (gastroesophageal reflux disease)   . Hypertension   . URI (upper respiratory infection) 05/30/2014   Past Surgical History:  Procedure Laterality Date  . ABDOMINAL HYSTERECTOMY    . CARPAL TUNNEL RELEASE Bilateral   . INCONTINENCE SURGERY     History reviewed. No pertinent family history. Social History  Substance Use Topics  . Smoking status: Never Smoker  . Smokeless tobacco: Not on file  . Alcohol use Yes     Comment: socially   OB History    No data available     Review of Systems  Constitutional: Negative for chills and fever.  HENT: Negative.   Respiratory: Negative.   Cardiovascular: Negative.   Gastrointestinal: Positive for abdominal pain. Negative for diarrhea, nausea and vomiting.  Genitourinary: Negative.   Musculoskeletal:  Negative.   Skin: Negative.   Neurological: Negative.     Allergies  Patient has no known allergies.  Home Medications   Prior to Admission medications   Medication Sig Start Date End Date Taking? Authorizing Provider  gabapentin (NEURONTIN) 100 MG capsule Take 100 mg by mouth 3 (three) times daily.   Yes Historical Provider, MD  nitrofurantoin, macrocrystal-monohydrate, (MACROBID) 100 MG capsule Take 100 mg by mouth 2 (two) times daily.   Yes Historical Provider, MD  Olmesartan-Amlodipine-HCTZ (TRIBENZOR) 40-10-25 MG TABS Take 1 tablet by mouth daily with breakfast.   Yes Historical Provider, MD  potassium chloride SA (K-DUR,KLOR-CON) 20 MEQ tablet Take 20 mEq by mouth daily.   Yes Historical Provider, MD  ibuprofen (ADVIL,MOTRIN) 800 MG tablet Take 1 tablet (800 mg total) by mouth 3 (three) times daily. 04/22/17   Dorena Bodo, NP   Meds Ordered and Administered this Visit  Medications - No data to display  BP 128/81 (BP Location: Left Arm)   Pulse 78   Temp 98.2 F (36.8 C) (Oral)   Resp 16   SpO2 100%  No data found.   Physical Exam  Constitutional: She is oriented to person, place, and time. She appears well-developed and well-nourished. No distress.  HENT:  Head: Normocephalic and atraumatic.  Right Ear: External ear normal.  Left Ear: External ear normal.  Eyes: Conjunctivae are normal. Right eye exhibits no discharge. Left eye exhibits no discharge.  Cardiovascular: Normal rate and regular rhythm.   Pulmonary/Chest: Effort normal and breath sounds normal.  Abdominal: Soft. Bowel sounds are normal. There is no hepatosplenomegaly. There is no tenderness. There is no rebound, no CVA tenderness, no tenderness at McBurney's point and negative Murphy's sign.  Neurological: She is alert and oriented to person, place, and time.  Skin: Skin is warm and dry. Capillary refill takes less than 2 seconds. No rash noted. She is not diaphoretic. No erythema.  Psychiatric: She has  a normal mood and affect. Her behavior is normal.  Nursing note and vitals reviewed.   Urgent Care Course     Procedures (including critical care time)  Labs Review Labs Reviewed - No data to display  Imaging Review No results found.    MDM   1. Musculoskeletal pain    Index of suspicion is low for any type of abdominal issues such as cholecystitis or appendicitis, abdominal exam was negative. I believe this pain is more likely musculoskeletal in nature, she is had some relief with over-the-counter ibuprofen, and increasing this to 800 mg 3 times a day. I advise her to contact her urologist Monday morning to discuss the choice of antibiotics, follow-up with primary care if pain persists. Follow-up guidelines were given in case his pain worsens to go to the emergency room as soon as possible.    Dorena Bodo, NP 04/22/17 [phone]

## 2017-04-22 NOTE — ED Triage Notes (Signed)
The patient presented to the [facility] with a complaint of lower abdominal pain on the right side. The patient stated that she saw her PCP on Friday for a UTI.

## 2017-04-22 NOTE — Discharge Instructions (Signed)
Your signs and symptoms do not currently appear to consistent with appendicitis, or other common GI issues. I am starting you on a higher dose of ibuprofen for pain. Call your urologist tomorrow for an opinion with regard to the antibiotic therapy started at the walk in clinic. If at anytime your symptoms worsen, you develop fever, nausea, vomiting, diarrhea, worsening pain, go to the ER for further evaluation.

## 2017-04-24 DIAGNOSIS — R1031 Right lower quadrant pain: Secondary | ICD-10-CM | POA: Diagnosis not present

## 2017-04-24 DIAGNOSIS — N390 Urinary tract infection, site not specified: Secondary | ICD-10-CM | POA: Diagnosis not present

## 2017-04-26 ENCOUNTER — Other Ambulatory Visit: Payer: Self-pay | Admitting: Family Medicine

## 2017-04-26 DIAGNOSIS — R1031 Right lower quadrant pain: Secondary | ICD-10-CM

## 2017-04-26 DIAGNOSIS — R3129 Other microscopic hematuria: Secondary | ICD-10-CM

## 2017-04-26 DIAGNOSIS — N390 Urinary tract infection, site not specified: Secondary | ICD-10-CM

## 2017-04-30 ENCOUNTER — Ambulatory Visit
Admission: RE | Admit: 2017-04-30 | Discharge: 2017-04-30 | Disposition: A | Discharge status: Home / Self Care | Payer: BLUE CROSS/BLUE SHIELD | Source: Ambulatory Visit | Attending: Family Medicine | Admitting: Family Medicine

## 2017-04-30 DIAGNOSIS — R1031 Right lower quadrant pain: Secondary | ICD-10-CM | POA: Diagnosis not present

## 2017-04-30 DIAGNOSIS — N390 Urinary tract infection, site not specified: Secondary | ICD-10-CM

## 2017-04-30 DIAGNOSIS — R3129 Other microscopic hematuria: Secondary | ICD-10-CM

## 2017-06-19 DIAGNOSIS — M7062 Trochanteric bursitis, left hip: Secondary | ICD-10-CM | POA: Diagnosis not present

## 2017-06-19 DIAGNOSIS — M545 Low back pain: Secondary | ICD-10-CM | POA: Diagnosis not present

## 2017-06-19 DIAGNOSIS — M7061 Trochanteric bursitis, right hip: Secondary | ICD-10-CM | POA: Diagnosis not present

## 2017-07-20 DIAGNOSIS — R32 Unspecified urinary incontinence: Secondary | ICD-10-CM | POA: Diagnosis not present

## 2017-07-20 DIAGNOSIS — N393 Stress incontinence (female) (male): Secondary | ICD-10-CM | POA: Diagnosis not present

## 2017-07-20 DIAGNOSIS — Z87440 Personal history of urinary (tract) infections: Secondary | ICD-10-CM | POA: Diagnosis not present

## 2017-07-20 DIAGNOSIS — N390 Urinary tract infection, site not specified: Secondary | ICD-10-CM | POA: Diagnosis not present

## 2017-07-24 DIAGNOSIS — I10 Essential (primary) hypertension: Secondary | ICD-10-CM | POA: Diagnosis not present

## 2017-07-24 DIAGNOSIS — N3642 Intrinsic sphincter deficiency (ISD): Secondary | ICD-10-CM | POA: Diagnosis not present

## 2017-07-24 DIAGNOSIS — N393 Stress incontinence (female) (male): Secondary | ICD-10-CM | POA: Diagnosis not present

## 2017-08-01 DIAGNOSIS — M5030 Other cervical disc degeneration, unspecified cervical region: Secondary | ICD-10-CM | POA: Diagnosis not present

## 2017-08-01 DIAGNOSIS — E876 Hypokalemia: Secondary | ICD-10-CM | POA: Diagnosis not present

## 2017-08-01 DIAGNOSIS — R5383 Other fatigue: Secondary | ICD-10-CM | POA: Diagnosis not present

## 2017-08-01 DIAGNOSIS — I10 Essential (primary) hypertension: Secondary | ICD-10-CM | POA: Diagnosis not present

## 2017-08-15 DIAGNOSIS — E669 Obesity, unspecified: Secondary | ICD-10-CM | POA: Diagnosis not present

## 2017-08-15 DIAGNOSIS — Z6829 Body mass index (BMI) 29.0-29.9, adult: Secondary | ICD-10-CM | POA: Diagnosis not present

## 2017-08-16 DIAGNOSIS — K130 Diseases of lips: Secondary | ICD-10-CM | POA: Diagnosis not present

## 2017-08-16 DIAGNOSIS — I499 Cardiac arrhythmia, unspecified: Secondary | ICD-10-CM | POA: Diagnosis not present

## 2017-08-16 DIAGNOSIS — Z6828 Body mass index (BMI) 28.0-28.9, adult: Secondary | ICD-10-CM | POA: Diagnosis not present

## 2017-09-12 DIAGNOSIS — N390 Urinary tract infection, site not specified: Secondary | ICD-10-CM | POA: Diagnosis not present

## 2017-09-12 DIAGNOSIS — R319 Hematuria, unspecified: Secondary | ICD-10-CM | POA: Diagnosis not present

## 2017-09-17 ENCOUNTER — Emergency Department (HOSPITAL_COMMUNITY): Payer: BLUE CROSS/BLUE SHIELD

## 2017-09-17 ENCOUNTER — Other Ambulatory Visit: Payer: Self-pay

## 2017-09-17 ENCOUNTER — Encounter (HOSPITAL_COMMUNITY): Payer: Self-pay | Admitting: Emergency Medicine

## 2017-09-17 DIAGNOSIS — E876 Hypokalemia: Secondary | ICD-10-CM | POA: Diagnosis not present

## 2017-09-17 DIAGNOSIS — I10 Essential (primary) hypertension: Secondary | ICD-10-CM | POA: Diagnosis not present

## 2017-09-17 DIAGNOSIS — R002 Palpitations: Secondary | ICD-10-CM | POA: Diagnosis not present

## 2017-09-17 DIAGNOSIS — I493 Ventricular premature depolarization: Secondary | ICD-10-CM | POA: Insufficient documentation

## 2017-09-17 DIAGNOSIS — Z79899 Other long term (current) drug therapy: Secondary | ICD-10-CM | POA: Diagnosis not present

## 2017-09-17 DIAGNOSIS — R0789 Other chest pain: Secondary | ICD-10-CM | POA: Diagnosis not present

## 2017-09-17 LAB — I-STAT TROPONIN, ED: Troponin i, poc: 0 ng/mL (ref 0.00–0.08)

## 2017-09-17 LAB — BASIC METABOLIC PANEL
ANION GAP: 8 (ref 5–15)
BUN: 10 mg/dL (ref 6–20)
CALCIUM: 9.3 mg/dL (ref 8.9–10.3)
CO2: 27 mmol/L (ref 22–32)
Chloride: 103 mmol/L (ref 101–111)
Creatinine, Ser: 0.71 mg/dL (ref 0.44–1.00)
GFR calc non Af Amer: >60 mL/min (ref >60)
Glucose, Bld: 117 mg/dL — ABNORMAL HIGH (ref 65–99)
Potassium: 3.1 mmol/L — ABNORMAL LOW (ref 3.5–5.1)
SODIUM: 138 mmol/L (ref 135–145)

## 2017-09-17 LAB — CBC
HCT: 37 % (ref 36.0–46.0)
Hemoglobin: 12.1 g/dL (ref 12.0–15.0)
MCH: 27.3 pg (ref 26.0–34.0)
MCHC: 32.7 g/dL (ref 30.0–36.0)
MCV: 83.5 fL (ref 78.0–100.0)
PLATELETS: 240 10*3/uL (ref 150–400)
RBC: 4.43 MIL/uL (ref 3.87–5.11)
RDW: 13.5 % (ref 11.5–15.5)
WBC: 5.4 10*3/uL (ref 4.0–10.5)

## 2017-09-17 NOTE — ED Triage Notes (Signed)
Pt. Stated, I started having some chest heaviness and some tingling was in my fingers and toes since last night.

## 2017-09-18 ENCOUNTER — Emergency Department (HOSPITAL_COMMUNITY)
Admission: EM | Admit: 2017-09-18 | Discharge: 2017-09-18 | Disposition: A | Discharge status: Home / Self Care | Payer: BLUE CROSS/BLUE SHIELD | Attending: Emergency Medicine | Admitting: Emergency Medicine

## 2017-09-18 DIAGNOSIS — E876 Hypokalemia: Secondary | ICD-10-CM

## 2017-09-18 DIAGNOSIS — R002 Palpitations: Secondary | ICD-10-CM

## 2017-09-18 DIAGNOSIS — I493 Ventricular premature depolarization: Secondary | ICD-10-CM

## 2017-09-18 LAB — I-STAT TROPONIN, ED: Troponin i, poc: 0.01 ng/mL (ref 0.00–0.08)

## 2017-09-18 LAB — D-DIMER, QUANTITATIVE: D-Dimer, Quant: 0.31 ug/mL-FEU (ref 0.00–0.50)

## 2017-09-18 MED ORDER — IBUPROFEN 400 MG PO TABS
600.0000 mg | ORAL_TABLET | Freq: Once | ORAL | Status: AC
  Administered 2017-09-18: 600 mg via ORAL
  Filled 2017-09-18: qty 1

## 2017-09-18 MED ORDER — POTASSIUM CHLORIDE CRYS ER 20 MEQ PO TBCR: 20.0000 meq | EXTENDED_RELEASE_TABLET | Freq: Two times a day (BID) | ORAL | 0 refills | Status: DC

## 2017-09-18 NOTE — ED Notes (Signed)
ED physician at bedside.

## 2017-09-18 NOTE — ED Notes (Signed)
Patient Alert and oriented X4. Stable and ambulatory. Patient verbalized understanding of the discharge instructions.  Patient belongings were taken by the patient.  

## 2017-09-18 NOTE — ED Provider Notes (Addendum)
[facility]-EMERGENCY DEPT Provider Note   CSN: [id number] Arrival date & time: 09/17/17  1622     History   Chief Complaint Chief Complaint  Patient presents with  . Chest Pain  . Weakness    HPI Carla Frank is a 56 y.o. female.  HPI  This is a 56 year old female with history of hypertension who presents with palpitations and slight chest heaviness. Patient reports she had onset of these symptoms while at work.Currently she is asymptomatic. She reports palpitation and feeling like her heart is fluttering. She reports mild associated chest pressure. No shortness of breath. Denies any history of blood clots, leg swelling, recent long travel.She had one episode similar to this one month ago. She has history of hypertension. Denies history of hyperlipidemia, early family history of heart disease, diabetes. No recent changes in medications or increased caffeine use.  Past Medical History:  Diagnosis Date  . Chronic UTI    since bladder sling surgery  . Constipation   . DDD (degenerative disc disease), lumbar   . GERD (gastroesophageal reflux disease)   . Hypertension   . URI (upper respiratory infection) 05/30/2014    Patient Active Problem List   Diagnosis Date Noted  . Degenerative spondylolisthesis 07/20/2014    Past Surgical History:  Procedure Laterality Date  . ABDOMINAL HYSTERECTOMY    . CARPAL TUNNEL RELEASE Bilateral   . INCONTINENCE SURGERY      OB History    No data available       Home Medications    Prior to Admission medications   Medication Sig Start Date End Date Taking? Authorizing Provider  ciprofloxacin (CIPRO) 500 MG tablet Take 500 mg by mouth 2 (two) times daily. Started 09/12/17 for 7 days   Yes [provider]  estradiol (ESTRACE) 0.1 MG/GM vaginal cream Place 1 g vaginally every 7 (seven) days.   Yes [provider]  gabapentin (NEURONTIN) 100 MG capsule Take 100 mg by mouth 3 (three) times daily.   Yes [provider]  nystatin (MYCOSTATIN) 100000 UNIT/ML suspension Take 5 mLs by mouth 4 (four) times daily. Started 09/14/17 for 10 days  09/14/17 09/24/17 Yes [provider]  Olmesartan-Amlodipine-HCTZ (TRIBENZOR) 40-10-25 MG TABS Take 1 tablet by mouth daily with breakfast.   Yes [provider]  ibuprofen (ADVIL,MOTRIN) 800 MG tablet Take 1 tablet (800 mg total) by mouth 3 (three) times daily. Patient not taking: Reported on 09/18/2017 04/22/17   Dorena Bodo, NP  potassium chloride SA (K-DUR,KLOR-CON) 20 MEQ tablet Take 1 tablet (20 mEq total) by mouth 2 (two) times daily. 09/18/17   Horton, Mayer Masker, MD    Family History No family history on file.  Social History Social History  Substance Use Topics  . Smoking status: Never Smoker  . Smokeless tobacco: Never Used  . Alcohol use Yes     Comment: socially     Allergies   Patient has no known allergies.   Review of Systems Review of Systems  Constitutional: Negative for fever.  Respiratory: Positive for chest tightness. Negative for shortness of breath.   Cardiovascular: Positive for palpitations. Negative for chest pain and leg swelling.  Gastrointestinal: Negative for abdominal pain, diarrhea and vomiting.  Genitourinary: Negative for dysuria.  All other systems reviewed and are negative.    Physical Exam Updated Vital Signs BP 119/88   Pulse 77   Temp 98.4 F (36.9 C) (Oral)   Resp 17   Ht  (1.651 m)  Wt 77.6 kg (171 lb)   SpO2 97%   BMI 28.46 kg/m   Physical Exam  Constitutional: She is oriented to person, place, and time. She appears well-developed and well-nourished. No distress.  HENT:  Head: Normocephalic and atraumatic.  Cardiovascular: Normal rate, regular rhythm and normal heart sounds.   Pulmonary/Chest: Effort normal and breath sounds normal. No respiratory distress. She has no wheezes.  Abdominal: Soft. There is no tenderness.  Musculoskeletal: She exhibits no edema.  Neurological:  She is alert and oriented to person, place, and time.  Skin: Skin is warm and dry.  Psychiatric: She has a normal mood and affect.  Nursing note and vitals reviewed.    ED Treatments / Results  Labs (all labs ordered are listed, but only abnormal results are displayed) Labs Reviewed  BASIC METABOLIC PANEL - Abnormal; Notable for the following:       Result Value   Potassium 3.1 (*)    Glucose, Bld 117 (*)    All other components within normal limits  CBC  D-DIMER, QUANTITATIVE (NOT AT [facility])  I-STAT TROPONIN, ED  I-STAT TROPONIN, ED    EKG  EKG Interpretation  Date/Time:  Monday September 17 2017 16:31:03 EDT Ventricular Rate:  83 PR Interval:  178 QRS Duration: 148 QT Interval:  420 QTC Calculation: 493 R Axis:   -81 Text Interpretation:  Sinus rhythm with occasional Premature ventricular complexes Left axis deviation Right bundle branch block Abnormal ECG When compared with ECG of 07/08/2014, Premature ventricular complexes are now present Confirmed by Dione Booze ([id number]) on 09/17/2017 11:38:02 PM       Radiology Dg Chest 2 View  Result Date: 09/17/2017 CLINICAL DATA:  Chest heaviness, tingling in fingers and toes since last night there EXAM: CHEST  2 VIEW COMPARISON:  Chest x-ray dated 07/08/2014. FINDINGS: The heart size and mediastinal contours are within normal limits. Both lungs are clear. Mild degenerative spurring within the thoracic spine. No acute or suspicious osseous finding. IMPRESSION: No active cardiopulmonary disease. No evidence of pneumonia or pulmonary edema. Electronically Signed   By: Bary Richard M.D.   On: 09/17/2017 17:28    Procedures Procedures (including critical care time)  Medications Ordered in ED Medications - No data to display   Initial Impression / Assessment and Plan / ED Course  I have reviewed the triage vital signs and the nursing notes.  Pertinent labs & imaging results that were available during my care of the patient were  reviewed by me and considered in my medical decision making (see chart for details).     Patient presents with palpitations.nontoxic-appearing. Vital signs reassuring. Currently asymptomatic. EKG shows an occasional PVC. Otherwise no evidence of ischemia. Initial troponin negative. Patient's exam is benign. She is fairly low risk for heart disease with a heart score of 2. Repeat troponin and d-dimer were sent to screen for blood clots. These are reassuring. Patient may be symptomatic from her PVCs.  Potassium is slightly decreased. Will supplement and have her follow-up closely with cardiology.  After history, exam, and medical workup I feel the patient has been appropriately medically screened and is safe for discharge home. Pertinent diagnoses were discussed with the patient. Patient was given return precautions.   Final Clinical Impressions(s) / ED Diagnoses   Final diagnoses:  Palpitations  PVC (premature ventricular contraction)  Low blood potassium    New Prescriptions Current Discharge Medication List       Shon Baton, MD 09/18/17 [phone]  Shon Baton, MD 09/18/17 [phone]

## 2017-09-18 NOTE — Discharge Instructions (Signed)
You were seen today for palpitations. Your workup is reassuring including heart tests and screening test for blood clots. Follow-up closely with cardiology for further evaluation.

## 2017-09-20 ENCOUNTER — Encounter: Payer: Self-pay | Admitting: Internal Medicine

## 2017-09-20 ENCOUNTER — Ambulatory Visit (INDEPENDENT_AMBULATORY_CARE_PROVIDER_SITE_OTHER): Payer: BLUE CROSS/BLUE SHIELD | Admitting: Internal Medicine

## 2017-09-20 VITALS — BP 98/72 | HR 75 | Ht 65.0 in | Wt 170.2 lb

## 2017-09-20 DIAGNOSIS — I493 Ventricular premature depolarization: Secondary | ICD-10-CM

## 2017-09-20 DIAGNOSIS — R0789 Other chest pain: Secondary | ICD-10-CM

## 2017-09-20 NOTE — Patient Instructions (Addendum)
Your physician has requested that you have an echocardiogram @ [address] - [address]. Echocardiography is a painless test that uses sound waves to create images of your heart. It provides your doctor with information about the size and shape of your heart and how well your heart's chambers and valves are working. This procedure takes approximately one hour. There are no restrictions for this procedure.  Your physician recommends that you schedule a follow-up appointment with Dr. Hilty after your test.   

## 2017-09-21 ENCOUNTER — Ambulatory Visit: Payer: BLUE CROSS/BLUE SHIELD | Admitting: Internal Medicine

## 2017-09-21 DIAGNOSIS — R0789 Other chest pain: Secondary | ICD-10-CM | POA: Insufficient documentation

## 2017-09-21 DIAGNOSIS — I493 Ventricular premature depolarization: Secondary | ICD-10-CM | POA: Insufficient documentation

## 2017-09-21 NOTE — Progress Notes (Signed)
OFFICE NOTE  Chief Complaint:  Follow-up ER visit for chest pain  Primary Care Physician: Carla James, MD  HPI:  Carla Frank is a 56 y.o. female with a past medial history significant for hypertension, GERD, chronic UTIs and recent chest pain, palpitations and weakness. Carla Frank recently was seen in the emergency department on 09/18/2017 with palpitations. EKG showed PVCs for which she had chest discomfort. Workup revealed mild hypokalemia troponins were negative. She was discharged with instruction to follow-up with cardiology. She reports no further chest pain or significant palpitations. Blood pressure is low today at 98/72. There is no significant family history of heart disease. She is an occasional alcohol user but denies any smoking and does some infrequent exercise. She describes the discomfort as a pounding sensation in her chest and may likely be related to palpitations. She does not say that her heart was racing rather they were "hard" heartbeats.  PMHx:  Past Medical History:  Diagnosis Date  . Chronic UTI    since bladder sling surgery  . Constipation   . DDD (degenerative disc disease), lumbar   . GERD (gastroesophageal reflux disease)   . Hypertension   . URI (upper respiratory infection) 05/30/2014    Past Surgical History:  Procedure Laterality Date  . ABDOMINAL HYSTERECTOMY    . CARPAL TUNNEL RELEASE Bilateral   . INCONTINENCE SURGERY      FAMHx:  Family History  Problem Relation Age of Onset  . Dementia Mother     SOCHx:   reports that she has never smoked. She has never used smokeless tobacco. She reports that she drinks alcohol. She reports that she does not use drugs.  ALLERGIES:  No Known Allergies  ROS: Pertinent items noted in HPI and remainder of comprehensive ROS otherwise negative.  HOME MEDS: Current Outpatient Prescriptions on File Prior to Visit  Medication Sig Dispense Refill  . estradiol (ESTRACE) 0.1 MG/GM vaginal cream Place 1  g vaginally every 7 (seven) days.    Marland Kitchen gabapentin (NEURONTIN) 100 MG capsule Take 100 mg by mouth 3 (three) times daily.    Marland Kitchen ibuprofen (ADVIL,MOTRIN) 800 MG tablet Take 1 tablet (800 mg total) by mouth 3 (three) times daily. 21 tablet 0  . nystatin (MYCOSTATIN) 100000 UNIT/ML suspension Take 5 mLs by mouth 4 (four) times daily. Started 09/14/17 for 10 days     . Olmesartan-Amlodipine-HCTZ (TRIBENZOR) 40-10-25 MG TABS Take 1 tablet by mouth daily with breakfast.    . potassium chloride SA (K-DUR,KLOR-CON) 20 MEQ tablet Take 1 tablet (20 mEq total) by mouth 2 (two) times daily. 30 tablet 0   No current facility-administered medications on file prior to visit.     LABS/IMAGING: No results found for this or any previous visit (from the past 48 hour(s)). No results found.  LIPID PANEL: No results found for: CHOL, TRIG, HDL, CHOLHDL, VLDL, LDLCALC, LDLDIRECT   WEIGHTS: Wt Readings from Last 3 Encounters:  09/20/17 170 lb 3.2 oz (77.2 kg)  09/17/17 171 lb (77.6 kg)  07/20/14 182 lb (82.6 kg)    VITALS: BP 98/72 (BP Location: Left Arm)   Pulse 75   Ht  (1.651 m)   Wt 170 lb 3.2 oz (77.2 kg)   SpO2 97%   BMI 28.32 kg/m   EXAM: General appearance: alert and no distress Neck: no carotid bruit, no JVD and thyroid not enlarged, symmetric, no tenderness/mass/nodules Lungs: clear to auscultation bilaterally Heart: regular rate and rhythm, S1, S2 normal, no murmur,  click, rub or gallop Abdomen: soft, non-tender; bowel sounds normal; no masses,  no organomegaly Extremities: extremities normal, atraumatic, no cyanosis or edema Pulses: 2+ and symmetric Skin: Skin color, texture, turgor normal. No rashes or lesions Neurologic: Grossly normal Psychological plus  EKG: Deferred   ASSESSMENT: 1. Palpitations 2. PVCs  PLAN: 1.   Carla Frank had PVCs in the emergency department likely the cause of her palpitations and possibly chest discomfort. It's important no whether she has normal  LV function and no wall motion abnormalities and will order an echocardiogram. Since these are at rest and she's had no anginal symptoms are not recommending stress testing. Her blood pressure is low and may not tolerate the addition of a beta blocker. Follow-up after her echo.  Chrystie Nose, MD, [facility]  [place]  [facility]  Attending Cardiologist  Direct Dial: [phone]  Fax: [phone]  Website:  [email or web address] Nicely Keng Jewel 09/21/2017, 2:05 PM

## 2017-09-27 ENCOUNTER — Other Ambulatory Visit (HOSPITAL_COMMUNITY): Payer: BLUE CROSS/BLUE SHIELD

## 2017-10-22 DIAGNOSIS — R32 Unspecified urinary incontinence: Secondary | ICD-10-CM | POA: Diagnosis not present

## 2017-10-22 DIAGNOSIS — R319 Hematuria, unspecified: Secondary | ICD-10-CM | POA: Diagnosis not present

## 2017-10-22 DIAGNOSIS — N390 Urinary tract infection, site not specified: Secondary | ICD-10-CM | POA: Diagnosis not present

## 2017-10-26 ENCOUNTER — Ambulatory Visit: Payer: BLUE CROSS/BLUE SHIELD | Admitting: Internal Medicine

## 2017-11-01 DIAGNOSIS — M542 Cervicalgia: Secondary | ICD-10-CM | POA: Diagnosis not present

## 2017-11-01 DIAGNOSIS — M25522 Pain in left elbow: Secondary | ICD-10-CM | POA: Diagnosis not present

## 2017-11-01 DIAGNOSIS — S46812A Strain of other muscles, fascia and tendons at shoulder and upper arm level, left arm, initial encounter: Secondary | ICD-10-CM | POA: Diagnosis not present

## 2017-11-09 DIAGNOSIS — M7061 Trochanteric bursitis, right hip: Secondary | ICD-10-CM | POA: Diagnosis not present

## 2017-11-09 DIAGNOSIS — M1612 Unilateral primary osteoarthritis, left hip: Secondary | ICD-10-CM | POA: Diagnosis not present

## 2017-11-09 DIAGNOSIS — M160 Bilateral primary osteoarthritis of hip: Secondary | ICD-10-CM | POA: Diagnosis not present

## 2017-11-09 DIAGNOSIS — M1611 Unilateral primary osteoarthritis, right hip: Secondary | ICD-10-CM | POA: Diagnosis not present

## 2017-11-09 DIAGNOSIS — M7062 Trochanteric bursitis, left hip: Secondary | ICD-10-CM | POA: Diagnosis not present

## 2017-11-20 DIAGNOSIS — Z6829 Body mass index (BMI) 29.0-29.9, adult: Secondary | ICD-10-CM | POA: Diagnosis not present

## 2017-11-20 DIAGNOSIS — Z1231 Encounter for screening mammogram for malignant neoplasm of breast: Secondary | ICD-10-CM | POA: Diagnosis not present

## 2017-11-20 DIAGNOSIS — Z01419 Encounter for gynecological examination (general) (routine) without abnormal findings: Secondary | ICD-10-CM | POA: Diagnosis not present

## 2017-11-30 DIAGNOSIS — M1611 Unilateral primary osteoarthritis, right hip: Secondary | ICD-10-CM | POA: Diagnosis not present

## 2017-11-30 DIAGNOSIS — N3000 Acute cystitis without hematuria: Secondary | ICD-10-CM | POA: Diagnosis not present

## 2017-11-30 DIAGNOSIS — M160 Bilateral primary osteoarthritis of hip: Secondary | ICD-10-CM | POA: Diagnosis not present

## 2017-11-30 DIAGNOSIS — M1612 Unilateral primary osteoarthritis, left hip: Secondary | ICD-10-CM | POA: Diagnosis not present

## 2017-12-26 DIAGNOSIS — N390 Urinary tract infection, site not specified: Secondary | ICD-10-CM | POA: Diagnosis not present

## 2017-12-31 DIAGNOSIS — I10 Essential (primary) hypertension: Secondary | ICD-10-CM | POA: Diagnosis not present

## 2017-12-31 DIAGNOSIS — R300 Dysuria: Secondary | ICD-10-CM | POA: Diagnosis not present

## 2017-12-31 DIAGNOSIS — M1611 Unilateral primary osteoarthritis, right hip: Secondary | ICD-10-CM | POA: Diagnosis not present

## 2017-12-31 DIAGNOSIS — Z01818 Encounter for other preprocedural examination: Secondary | ICD-10-CM | POA: Diagnosis not present

## 2018-01-21 DIAGNOSIS — J069 Acute upper respiratory infection, unspecified: Secondary | ICD-10-CM | POA: Diagnosis not present

## 2018-01-21 DIAGNOSIS — J0100 Acute maxillary sinusitis, unspecified: Secondary | ICD-10-CM | POA: Diagnosis not present

## 2018-02-15 DIAGNOSIS — M160 Bilateral primary osteoarthritis of hip: Secondary | ICD-10-CM | POA: Diagnosis not present

## 2018-02-17 ENCOUNTER — Other Ambulatory Visit: Payer: Self-pay

## 2018-02-17 ENCOUNTER — Encounter (HOSPITAL_COMMUNITY): Payer: Self-pay | Admitting: Emergency Medicine

## 2018-02-17 ENCOUNTER — Emergency Department (HOSPITAL_COMMUNITY): Payer: BLUE CROSS/BLUE SHIELD

## 2018-02-17 ENCOUNTER — Emergency Department (HOSPITAL_COMMUNITY)
Admission: EM | Admit: 2018-02-17 | Discharge: 2018-02-17 | Disposition: A | Discharge status: Home / Self Care | Payer: BLUE CROSS/BLUE SHIELD | Attending: Emergency Medicine | Admitting: Emergency Medicine

## 2018-02-17 DIAGNOSIS — Z79899 Other long term (current) drug therapy: Secondary | ICD-10-CM | POA: Insufficient documentation

## 2018-02-17 DIAGNOSIS — R002 Palpitations: Secondary | ICD-10-CM | POA: Diagnosis not present

## 2018-02-17 DIAGNOSIS — I10 Essential (primary) hypertension: Secondary | ICD-10-CM | POA: Diagnosis not present

## 2018-02-17 DIAGNOSIS — E876 Hypokalemia: Secondary | ICD-10-CM | POA: Diagnosis not present

## 2018-02-17 LAB — URINALYSIS, ROUTINE W REFLEX MICROSCOPIC
BILIRUBIN URINE: NEGATIVE
Glucose, UA: 50 mg/dL — AB
HGB URINE DIPSTICK: NEGATIVE
KETONES UR: NEGATIVE mg/dL
LEUKOCYTES UA: NEGATIVE
NITRITE: NEGATIVE
PH: 7 (ref 5.0–8.0)
Protein, ur: 30 mg/dL — AB
Specific Gravity, Urine: 1.02 (ref 1.005–1.030)

## 2018-02-17 LAB — CBC
HEMATOCRIT: 38.1 % (ref 36.0–46.0)
Hemoglobin: 12.9 g/dL (ref 12.0–15.0)
MCH: 27.9 pg (ref 26.0–34.0)
MCHC: 33.9 g/dL (ref 30.0–36.0)
MCV: 82.3 fL (ref 78.0–100.0)
PLATELETS: 248 10*3/uL (ref 150–400)
RBC: 4.63 MIL/uL (ref 3.87–5.11)
RDW: 13.9 % (ref 11.5–15.5)
WBC: 8.4 10*3/uL (ref 4.0–10.5)

## 2018-02-17 LAB — BASIC METABOLIC PANEL
Anion gap: 14 (ref 5–15)
BUN: 11 mg/dL (ref 6–20)
CO2: 25 mmol/L (ref 22–32)
CREATININE: 0.48 mg/dL (ref 0.44–1.00)
Calcium: 9.7 mg/dL (ref 8.9–10.3)
Chloride: 98 mmol/L — ABNORMAL LOW (ref 101–111)
GFR calc Af Amer: >60 mL/min (ref >60)
GFR calc non Af Amer: >60 mL/min (ref >60)
GLUCOSE: 149 mg/dL — AB (ref 65–99)
POTASSIUM: 2.8 mmol/L — AB (ref 3.5–5.1)
SODIUM: 137 mmol/L (ref 135–145)

## 2018-02-17 LAB — I-STAT BETA HCG BLOOD, ED (MC, WL, AP ONLY): I-stat hCG, quantitative: <5 m[IU]/mL (ref <5)

## 2018-02-17 LAB — I-STAT TROPONIN, ED: Troponin i, poc: 0 ng/mL (ref 0.00–0.08)

## 2018-02-17 MED ORDER — POTASSIUM CHLORIDE CRYS ER 20 MEQ PO TBCR
40.0000 meq | EXTENDED_RELEASE_TABLET | Freq: Once | ORAL | Status: AC
  Administered 2018-02-17: 40 meq via ORAL
  Filled 2018-02-17: qty 2

## 2018-02-17 MED ORDER — ONDANSETRON HCL 4 MG/2ML IJ SOLN
4.0000 mg | Freq: Once | INTRAMUSCULAR | Status: AC
  Administered 2018-02-17: 4 mg via INTRAVENOUS
  Filled 2018-02-17: qty 2

## 2018-02-17 MED ORDER — POTASSIUM CHLORIDE 10 MEQ/100ML IV SOLN
10.0000 meq | Freq: Once | INTRAVENOUS | Status: AC
  Administered 2018-02-17: 10 meq via INTRAVENOUS
  Filled 2018-02-17: qty 100

## 2018-02-17 MED ORDER — POTASSIUM CHLORIDE CRYS ER 20 MEQ PO TBCR: 20.0000 meq | EXTENDED_RELEASE_TABLET | Freq: Two times a day (BID) | ORAL | 0 refills | Status: AC

## 2018-02-17 NOTE — ED Triage Notes (Signed)
Patient here from home with complaints of nausea, palpitations that start at midnight last night. Patient diaphoretic in triage.

## 2018-02-17 NOTE — ED Notes (Signed)
Pt. Documented in error Saline Lock IV, Maintain IV access not by Diplomatic Services operational officersecretary but this Clinical research associatewriter.

## 2018-02-17 NOTE — ED Provider Notes (Addendum)
[facility]-EMERGENCY DEPT Provider Note   CSN: [id number] Arrival date & time: 02/17/18  0305     History   Chief Complaint Chief Complaint  Patient presents with  . Palpitations  . Nausea    HPI Carla Frank is a 57 y.o. female.  HPI  This is a 57 year old female with a history of chronic UTIs, degenerative disc disease, hypertension who presents with nausea and palpitations.  Patient reports that recently she has had upper respiratory symptoms for the last week.  She has been treating herself for an upper respiratory virus.  She has taken Mucinex.  She reports nonproductive cough and nasal congestion.  She also recently had a sinus infection.  1-2 days ago she noted dysuria and frequency which are symptoms consistent with her prior UTIs..  She is a history of chronic UTIs.  She started taking Macrobid which she has at home and has had some improvement.  She denies any fevers or flank pain.  She denies chills.  At approximately 12 AM she had onset of palpitations.  She denies any chest pain or shortness of breath.  Never had palpitations before.  Denies any recent changes in medications.  Denies taking Sudafed.  Denies any recent changes in caffeine intake.  Currently she denies any palpitations.  She is due to have surgery on Tuesday and was concerned so wanted to get it checked out.  Past Medical History:  Diagnosis Date  . Chronic UTI    since bladder sling surgery  . Constipation   . DDD (degenerative disc disease), lumbar   . GERD (gastroesophageal reflux disease)   . Hypertension   . URI (upper respiratory infection) 05/30/2014    Patient Active Problem List   Diagnosis Date Noted  . PVC's (premature ventricular contractions) 09/21/2017  . Atypical chest pain 09/21/2017  . Degenerative spondylolisthesis 07/20/2014    Past Surgical History:  Procedure Laterality Date  . ABDOMINAL HYSTERECTOMY    . CARPAL TUNNEL RELEASE Bilateral   .  INCONTINENCE SURGERY      OB History    No data available       Home Medications    Prior to Admission medications   Medication Sig Start Date End Date Taking? Authorizing Provider  estradiol (ESTRACE) 0.1 MG/GM vaginal cream Place 1 g vaginally every 7 (seven) days.   Yes [provider]  gabapentin (NEURONTIN) 100 MG capsule Take 100 mg by mouth 3 (three) times daily.   Yes [provider]  ibuprofen (ADVIL,MOTRIN) 800 MG tablet Take 1 tablet (800 mg total) by mouth 3 (three) times daily. Patient taking differently: Take 800 mg by mouth every 8 (eight) hours as needed for moderate pain.  04/22/17  Yes Dorena Bodo, NP  ipratropium (ATROVENT) 0.03 % nasal spray Place 2 sprays into both nostrils 2 (two) times daily as needed for rhinitis.   Yes [provider]  Melatonin 5 MG TABS Take 5 mg by mouth See admin instructions. Monday-Friday    Yes [provider]  Multiple Vitamin (MULTIVITAMIN WITH MINERALS) TABS tablet Take 1 tablet by mouth daily.   Yes [provider]  nitrofurantoin (MACRODANTIN) 50 MG capsule Take 50 mg by mouth at bedtime.   Yes [provider]  Olmesartan-Amlodipine-HCTZ (TRIBENZOR) 40-10-25 MG TABS Take 1 tablet by mouth daily with breakfast.   Yes [provider]  potassium chloride SA (K-DUR,KLOR-CON) 20 MEQ tablet Take 1 tablet (20 mEq total) by mouth 2 (two) times daily. 09/18/17  Yes Lavante Toso, Mayer Maskerourtney F, MD  ranitidine (ZANTAC) 150 MG capsule Take 150 mg by mouth 2 (two) times daily as needed for heartburn.   Yes [provider]  cephALEXin (KEFLEX) 500 MG capsule Take 500 mg by mouth daily.    [provider]    Family History Family History  Problem Relation Age of Onset  . Dementia Mother     Social History Social History   Tobacco Use  . Smoking status: Never Smoker  . Smokeless tobacco: Never Used  Substance Use Topics  . Alcohol use: Yes    Comment: socially  .  Drug use: No     Allergies   Patient has no known allergies.   Review of Systems Review of Systems  Constitutional: Negative for chills and fever.  HENT: Positive for congestion. Negative for sore throat.   Respiratory: Positive for cough. Negative for shortness of breath.   Cardiovascular: Positive for palpitations. Negative for chest pain and leg swelling.  Gastrointestinal: Positive for nausea. Negative for abdominal pain and vomiting.  Genitourinary: Positive for dysuria and frequency. Negative for flank pain.  Musculoskeletal: Negative for back pain.  Skin: Negative for rash.  Neurological: Negative for headaches.  All other systems reviewed and are negative.    Physical Exam Updated Vital Signs BP (!) 146/91 (BP Location: Left Arm)   Pulse 86   Temp 98.8 F (37.1 C) (Oral)   Resp 15   Ht 5\' 5"  (1.651 m)   Wt 77.6 kg (171 lb)   SpO2 98%   BMI 28.46 kg/m   Physical Exam  Constitutional: She is oriented to person, place, and time. She appears well-developed and well-nourished. No distress.  HENT:  Head: Normocephalic and atraumatic.  Mouth/Throat: Oropharynx is clear and moist. No oropharyngeal exudate.  Eyes: Pupils are equal, round, and reactive to light.  Neck: Neck supple.  Cardiovascular: Normal rate, regular rhythm and normal heart sounds.  No murmur heard. Pulmonary/Chest: Effort normal and breath sounds normal. No respiratory distress. She has no wheezes.  Abdominal: Soft. Bowel sounds are normal. There is no tenderness. There is no guarding.  Neurological: She is alert and oriented to person, place, and time.  Skin: Skin is warm and dry.  Psychiatric: She has a normal mood and affect.  Nursing note and vitals reviewed.    ED Treatments / Results  Labs (all labs ordered are listed, but only abnormal results are displayed) Labs Reviewed  BASIC METABOLIC PANEL - Abnormal; Notable for the following components:      Result Value   Potassium 2.8 (*)      Chloride 98 (*)    Glucose, Bld 149 (*)    All other components within normal limits  URINALYSIS, ROUTINE W REFLEX MICROSCOPIC - Abnormal; Notable for the following components:   Glucose, UA 50 (*)    Protein, ur 30 (*)    Bacteria, UA MANY (*)    Squamous Epithelial / LPF 0-5 (*)    All other components within normal limits  URINE CULTURE  CBC  I-STAT TROPONIN, ED  I-STAT BETA HCG BLOOD, ED ([facility], [facility], [facility] ONLY)    EKG  EKG Interpretation  Date/Time:  Sunday February 17 2018 03:42:39 EST Ventricular Rate:  99 PR Interval:    QRS Duration: 176 QT Interval:  412 QTC Calculation: 529 R Axis:   -127 Text Interpretation:  Sinus rhythm Right bundle branch block Inferior infarct, old No significant change since last tracing Confirmed by Ross MarcusHorton, Yaslene Lindamood ([id number])  on 02/17/2018 4:00:55 AM       Radiology Dg Chest 2 View  Result Date: 02/17/2018 CLINICAL DATA:  Palpitation EXAM: CHEST  2 VIEW COMPARISON:  09/17/2017 FINDINGS: The heart size and mediastinal contours are within normal limits. Both lungs are clear. Calcific tendinitis right shoulder. Partially visible hardware in the spine. IMPRESSION: No active cardiopulmonary disease. Electronically Signed   By: Jasmine Pang M.D.   On: 02/17/2018 04:07    Procedures Procedures (including critical care time)  Medications Ordered in ED Medications  ondansetron (ZOFRAN) injection 4 mg (4 mg Intravenous Given 02/17/18 0512)  potassium chloride SA (K-DUR,KLOR-CON) CR tablet 40 mEq (40 mEq Oral Given 02/17/18 0624)  potassium chloride 10 mEq in 100 mL IVPB (10 mEq Intravenous New Bag/Given 02/17/18 0624)     Initial Impression / Assessment and Plan / ED Course  I have reviewed the triage vital signs and the nursing notes.  Pertinent labs & imaging results that were available during my care of the patient were reviewed by me and considered in my medical decision making (see chart for details).     Patient presents with palpitations.   Recent history of upper respiratory symptoms and recurrence of urinary tract symptoms.  She is overall nontoxic appearing.  Afebrile.  Normotensive and not tachycardic.  EKG shows no signs of arrhythmia.  Troponin is negative.  Chest x-ray is reassuring without pneumothorax or pneumonia.  Doubt PE.  Basic lab work obtained.  Potassium noted to be 2.8.  Patient takes supplemental potassium.  Patient was given IV potassium and oral potassium.  Otherwise her lab work is reassuring.  She has had no recurrence of symptoms on the ER.  Urinalysis is difficult to interpret given recent antibiotic use.  Given symptoms, would recommend continuing Macrobid.  No signs or symptoms of sepsis or pyelonephritis.  Follow-up closely with primary physician recommended.  Denies any recent medication changes that would account for her symptoms.  After history, exam, and medical workup I feel the patient has been appropriately medically screened and is safe for discharge home. Pertinent diagnoses were discussed with the patient. Patient was given return precautions.   Final Clinical Impressions(s) / ED Diagnoses   Final diagnoses:  Hypokalemia  Palpitations    ED Discharge Orders    None       Shriyan Arakawa, Mayer Masker, MD 02/17/18 4098    Shon Baton, MD 02/17/18 [phone]

## 2018-02-17 NOTE — Discharge Instructions (Signed)
You were seen today for palpitations and nausea.  Your workup is notable for low potassium.  Increase potassium intake in your foods and continue to take 20 meq twice a day.  Follow-up with your primary physician.  Discuss workup with your surgeon prior to surgery on Tuesday.

## 2018-02-19 DIAGNOSIS — M1611 Unilateral primary osteoarthritis, right hip: Secondary | ICD-10-CM | POA: Diagnosis not present

## 2018-02-19 LAB — URINE CULTURE: Culture: >=100000 — AB

## 2018-02-20 ENCOUNTER — Telehealth: Payer: Self-pay | Admitting: Emergency Medicine

## 2018-02-20 NOTE — Progress Notes (Signed)
ED Antimicrobial Stewardship Positive Culture Follow Up   Carla AshingMiriam E Cunnington is an 57 y.o. female who [facility] with a chief complaint of  Chief Complaint  Patient presents with  . Palpitations  . Nausea    Recent Results (from the past 720 hour(s))  Urine culture     Status: Abnormal   Collection Time: 02/17/18  5:34 AM  Result Value Ref Range Status   Specimen Description   Final    URINE, CLEAN CATCH Performed at [facility], [address] [place], [place] [address]    Special Requests   Final    NONE Performed at [facility], [address] [place], [place] [address]    Culture >=100,000 COLONIES/mL ESCHERICHIA COLI (A)  Final   Report Status 02/19/2018 FINAL  Final   Organism ID, Bacteria ESCHERICHIA COLI (A)  Final      Susceptibility   Escherichia coli - MIC*    AMPICILLIN >=32 RESISTANT Resistant     CEFAZOLIN <=4 SENSITIVE Sensitive     CEFTRIAXONE <=1 SENSITIVE Sensitive     CIPROFLOXACIN >=4 RESISTANT Resistant     GENTAMICIN >=16 RESISTANT Resistant     IMIPENEM <=0.25 SENSITIVE Sensitive     NITROFURANTOIN 64 INTERMEDIATE Intermediate     TRIMETH/SULFA >=320 RESISTANT Resistant     AMPICILLIN/SULBACTAM 16 INTERMEDIATE Intermediate     PIP/TAZO <=4 SENSITIVE Sensitive     Extended ESBL NEGATIVE Sensitive     * >=100,000 COLONIES/mL ESCHERICHIA COLI    Needs additional follow-up: Call for symptom check - If still symptomatic - Start Keflex 500 mg bid x 7 days - If improving/resolving - no additional treatment warranted  ED Provider: Frederik PearMia McDonald, PA-C  Rolley SimsMartin, Kinya Meine Ann 02/20/2018, 8:51 AM Infectious Diseases Pharmacist Phone# [phone]

## 2018-02-20 NOTE — Telephone Encounter (Signed)
Post ED Visit - Positive Culture Follow-up: Successful Patient Follow-Up  Culture assessed and recommendations reviewed by: []  Enzo BiNathan Batchelder, Pharm.D. []  Celedonio MiyamotoJeremy Frens, Pharm.D., BCPS AQ-ID []  Garvin FilaMike Maccia, Pharm.D., BCPS [x]  Georgina PillionElizabeth Martin, Pharm.D., BCPS []  ChicalMinh Pham, [address]Pharm[address]D., BCPS, AAHIVP []  Estella HuskMichelle Turner, Pharm.D., BCPS, AAHIVP []  Lysle Pearlachel Rumbarger, PharmD, BCPS []  Casilda Carlsaylor Stone, PharmD, BCPS []  Pollyann SamplesAndy Johnston, PharmD, BCPS  Positive urine culture  [x]  Patient discharged without antimicrobial prescription and treatment is now indicated []  Organism is resistant to prescribed ED discharge antimicrobial []  Patient with positive blood cultures  Changes discussed with ED provider: Frederik PearMia McDonald PA New antibiotic prescription symptom check, + start Keflex 500mg  po bid x 7 days Called to [facility] [address] [phone]  Contacted patient, 02/20/18 1335   Berle MullMiller, Aroldo Galli 02/20/2018, 1:35 PM

## 2018-02-25 DIAGNOSIS — M25659 Stiffness of unspecified hip, not elsewhere classified: Secondary | ICD-10-CM | POA: Diagnosis not present

## 2018-03-01 DIAGNOSIS — N390 Urinary tract infection, site not specified: Secondary | ICD-10-CM | POA: Diagnosis not present

## 2018-03-26 DIAGNOSIS — Z96641 Presence of right artificial hip joint: Secondary | ICD-10-CM | POA: Diagnosis not present

## 2018-03-26 DIAGNOSIS — Z471 Aftercare following joint replacement surgery: Secondary | ICD-10-CM | POA: Diagnosis not present

## 2018-05-27 DIAGNOSIS — Z6829 Body mass index (BMI) 29.0-29.9, adult: Secondary | ICD-10-CM | POA: Diagnosis not present

## 2018-05-27 DIAGNOSIS — E663 Overweight: Secondary | ICD-10-CM | POA: Diagnosis not present

## 2018-06-17 DIAGNOSIS — Y768 Miscellaneous obstetric and gynecological devices associated with adverse incidents, not elsewhere classified: Secondary | ICD-10-CM | POA: Diagnosis not present

## 2018-06-17 DIAGNOSIS — R32 Unspecified urinary incontinence: Secondary | ICD-10-CM | POA: Diagnosis not present

## 2018-06-17 DIAGNOSIS — T83718D Erosion of other implanted mesh and other prosthetic materials to surrounding organ or tissue, subsequent encounter: Secondary | ICD-10-CM | POA: Diagnosis not present

## 2018-06-17 DIAGNOSIS — T83712D Erosion of implanted urethral mesh to surrounding organ or tissue, subsequent encounter: Secondary | ICD-10-CM | POA: Diagnosis not present

## 2018-06-17 DIAGNOSIS — N393 Stress incontinence (female) (male): Secondary | ICD-10-CM | POA: Diagnosis not present

## 2018-06-17 DIAGNOSIS — Y848 Other medical procedures as the cause of abnormal reaction of the patient, or of later complication, without mention of misadventure at the time of the procedure: Secondary | ICD-10-CM | POA: Diagnosis not present

## 2018-07-01 DIAGNOSIS — N3000 Acute cystitis without hematuria: Secondary | ICD-10-CM | POA: Diagnosis not present

## 2018-07-02 DIAGNOSIS — I10 Essential (primary) hypertension: Secondary | ICD-10-CM | POA: Diagnosis not present

## 2018-07-02 DIAGNOSIS — R42 Dizziness and giddiness: Secondary | ICD-10-CM | POA: Diagnosis not present

## 2018-07-02 DIAGNOSIS — N393 Stress incontinence (female) (male): Secondary | ICD-10-CM | POA: Diagnosis not present

## 2018-07-16 DIAGNOSIS — R300 Dysuria: Secondary | ICD-10-CM | POA: Diagnosis not present

## 2018-07-16 DIAGNOSIS — R42 Dizziness and giddiness: Secondary | ICD-10-CM | POA: Diagnosis not present

## 2018-07-29 DIAGNOSIS — Z6829 Body mass index (BMI) 29.0-29.9, adult: Secondary | ICD-10-CM | POA: Diagnosis not present

## 2018-07-29 DIAGNOSIS — E663 Overweight: Secondary | ICD-10-CM | POA: Diagnosis not present

## 2018-08-01 DIAGNOSIS — M1611 Unilateral primary osteoarthritis, right hip: Secondary | ICD-10-CM | POA: Diagnosis not present

## 2018-08-16 DIAGNOSIS — I10 Essential (primary) hypertension: Secondary | ICD-10-CM | POA: Diagnosis not present

## 2018-08-16 DIAGNOSIS — R202 Paresthesia of skin: Secondary | ICD-10-CM | POA: Diagnosis not present

## 2018-08-16 DIAGNOSIS — R42 Dizziness and giddiness: Secondary | ICD-10-CM | POA: Diagnosis not present

## 2018-08-19 ENCOUNTER — Encounter: Payer: Self-pay | Admitting: Neurology

## 2018-08-31 DIAGNOSIS — N393 Stress incontinence (female) (male): Secondary | ICD-10-CM | POA: Diagnosis not present

## 2018-09-26 DIAGNOSIS — N3000 Acute cystitis without hematuria: Secondary | ICD-10-CM | POA: Diagnosis not present

## 2018-09-26 DIAGNOSIS — R32 Unspecified urinary incontinence: Secondary | ICD-10-CM | POA: Diagnosis not present

## 2018-10-04 ENCOUNTER — Other Ambulatory Visit: Payer: Self-pay

## 2018-10-04 ENCOUNTER — Ambulatory Visit: Payer: BLUE CROSS/BLUE SHIELD | Admitting: Neurology

## 2018-10-04 ENCOUNTER — Encounter: Payer: Self-pay | Admitting: Neurology

## 2018-10-04 VITALS — BP 122/84 | HR 95 | Ht 65.0 in | Wt 180.0 lb

## 2018-10-04 DIAGNOSIS — R42 Dizziness and giddiness: Secondary | ICD-10-CM

## 2018-10-04 DIAGNOSIS — G629 Polyneuropathy, unspecified: Secondary | ICD-10-CM | POA: Diagnosis not present

## 2018-10-04 NOTE — Progress Notes (Signed)
NEUROLOGY CONSULTATION NOTE  Carla Frank MRN: [id number] DOB: July 26, 1961  Referring provider: Dr. Deatra James  Primary care provider: Dr. Deatra James   Reason for consult:  Dizziness, paresthesias  Dear Dr Wynelle Link:  Thank you for your kind referral of Carla Frank for consultation of the above symptoms. Although her history is well known to you, please allow me to reiterate it for the purpose of our medical record. The patient was accompanied to the clinic by her husband who also provides collateral information. Records and images were personally reviewed where available.  HISTORY OF PRESENT ILLNESS: This is a pleasant 57 year old right-handed woman with a history of hypertension, chronic neck and back pain, urinary incontinence with recurrent UTIs, presenting for evaluation of dizziness and paresthesias. She reports intermittent paresthesias in her fingers and toes have been ongoing for a while, she reports a history of back surgery and bilateral sciatica, as well as shingles in her back which had caused her to be more hypersensitive in general (putting on bobby pins or her hair up is uncomfortable). Around 2 months ago while at work, she was walking when she felt lightheaded and nauseated, then had numbness and tingling in both hands and feet. Within this time period, she had a second similar episode. She felt clammy and sweaty. They lasted 5 minutes and gradually resolved when she sat down. No spinning sensation. The nausea was intense enough for her to need to leave work. Since then, she has had around 4 episodes of feeling tingling and numbness in her hands and feet and then wobbly while walking, lasting 10 minutes. All the episodes have occurred while standing, never supine or sitting. No clear triggers, she does not think she was dehydrated those times, no sleep deprivation or alcohol. She denies any associated confusion, speech difficulties, or headaches. She has been having intermittent  headaches around her eyes with no associated nausea/vomiting, none this week, but last week she had a mild one daily. She has occasional palpitations. Sometimes she feels little pins are sticking in her stomach. She denies any diplopia, dysarthria/dysphagia, bowel dysfunction. She denies any family history of similar symptoms. She has been taking gabapentin 100mg  BID for postherpetic neuralgia, TID dosing caused concentration difficulties.  Diagnostic Data: She had an MRI brain and MRA head and neck in July 2014 for dizziness which I personally reviewed. MRI brain did not show any acute changes, there was minimal white matter disease, partially empty sella. MRA head and neck did not show any sginficant stenosis, there was markedly ectatic internal carotid artery segment without findings of fibromuscular dysplasia.  She had an MRI cervical and lumbar spine in December 2012 for pain. It showed degenerative changes with mild central canal stenosis at C2-3 from central disc protrusion, left facet arthropathy causing mild to moderate left foraminal stenosis, moderate central stenosis at C3-4, C4-5, mild central stenosis at C5-6, C6-7. No cord abnormality seen. There was prominent central stenosis at L3-4, mild right and borderline left foraminal steonsis at this level, severe central canal stenosis at L4-5. She had back surgery in 2015.  Laboratory Data: None available for review  PAST MEDICAL HISTORY: Past Medical History:  Diagnosis Date  . Chronic UTI    since bladder sling surgery  . Constipation   . DDD (degenerative disc disease), lumbar   . GERD (gastroesophageal reflux disease)   . Hypertension   . URI (upper respiratory infection) 05/30/2014    PAST SURGICAL HISTORY: Past Surgical History:  Procedure Laterality Date  . ABDOMINAL HYSTERECTOMY    . CARPAL TUNNEL RELEASE Bilateral   . INCONTINENCE SURGERY      MEDICATIONS: Current Outpatient Medications on File Prior to Visit    Medication Sig Dispense Refill  . cephALEXin (KEFLEX) 500 MG capsule Take 500 mg by mouth daily.    Marland Kitchen estradiol (ESTRACE) 0.1 MG/GM vaginal cream Place 1 g vaginally every 7 (seven) days.    Marland Kitchen gabapentin (NEURONTIN) 100 MG capsule Take 100 mg by mouth 3 (three) times daily.    Marland Kitchen ibuprofen (ADVIL,MOTRIN) 800 MG tablet Take 1 tablet (800 mg total) by mouth 3 (three) times daily. (Patient taking differently: Take 800 mg by mouth every 8 (eight) hours as needed for moderate pain. ) 21 tablet 0  . Multiple Vitamin (MULTIVITAMIN WITH MINERALS) TABS tablet Take 1 tablet by mouth daily.    . Olmesartan-Amlodipine-HCTZ (TRIBENZOR) 40-10-25 MG TABS Take 1 tablet by mouth daily with breakfast.    . potassium chloride SA (K-DUR,KLOR-CON) 20 MEQ tablet Take 1 tablet (20 mEq total) by mouth 2 (two) times daily. 20 tablet 0   No current facility-administered medications on file prior to visit.     ALLERGIES: No Known Allergies  FAMILY HISTORY: Family History  Problem Relation Age of Onset  . Dementia Mother     SOCIAL HISTORY: Social History   Socioeconomic History  . Marital status: Married    Spouse name: Not on file  . Number of children: Not on file  . Years of education: Not on file  . Highest education level: Not on file  Occupational History  . Not on file  Social Needs  . Financial resource strain: Not on file  . Food insecurity:    Worry: Not on file    Inability: Not on file  . Transportation needs:    Medical: Not on file    Non-medical: Not on file  Tobacco Use  . Smoking status: Never Smoker  . Smokeless tobacco: Never Used  Substance and Sexual Activity  . Alcohol use: Yes    Comment: socially  . Drug use: No  . Sexual activity: Yes    Birth control/protection: Surgical  Lifestyle  . Physical activity:    Days per week: Not on file    Minutes per session: Not on file  . Stress: Not on file  Relationships  . Social connections:    Talks on phone: Not on file     Gets together: Not on file    Attends religious service: Not on file    Active member of club or organization: Not on file    Attends meetings of clubs or organizations: Not on file    Relationship status: Not on file  . Intimate partner violence:    Fear of current or ex partner: Not on file    Emotionally abused: Not on file    Physically abused: Not on file    Forced sexual activity: Not on file  Other Topics Concern  . Not on file  Social History Narrative  . Not on file    REVIEW OF SYSTEMS: Constitutional: No fevers, chills, or sweats, no generalized fatigue, change in appetite Eyes: No visual changes, double vision, eye pain Ear, nose and throat: No hearing loss, ear pain, nasal congestion, sore throat Cardiovascular: No chest pain, palpitations Respiratory:  No shortness of breath at rest or with exertion, wheezes GastrointestinaI: No nausea, vomiting, diarrhea, abdominal pain, fecal incontinence Genitourinary:  No dysuria, urinary retention  or frequency Musculoskeletal:  No neck pain, back pain Integumentary: No rash, pruritus, skin lesions Neurological: as above Psychiatric: No depression, insomnia, anxiety Endocrine: No palpitations, fatigue, diaphoresis, mood swings, change in appetite, change in weight, increased thirst Hematologic/Lymphatic:  No anemia, purpura, petechiae. Allergic/Immunologic: no itchy/runny eyes, nasal congestion, recent allergic reactions, rashes  PHYSICAL EXAM: Vitals:   10/04/18 0855  BP: 122/84  Pulse: 95  SpO2: 98%   Orthostatic VS for the past 24 hrs (Last 3 readings):  BP- Lying Pulse- Lying BP- Sitting Pulse- Sitting BP- Standing at 0 minutes Pulse- Standing at 0 minutes  10/04/18 0941 114/76 80 132/90 74 110/80 85   General: No acute distress Head:  Normocephalic/atraumatic Eyes: Fundoscopic exam shows bilateral sharp discs, no vessel changes, exudates, or hemorrhages Neck: supple, no paraspinal tenderness, full range of  motion Back: No paraspinal tenderness Heart: regular rate and rhythm Lungs: Clear to auscultation bilaterally. Vascular: No carotid bruits. Skin/Extremities: No rash, no edema Neurological Exam: Mental status: alert and oriented to person, place, and time, no dysarthria or aphasia, Fund of knowledge is appropriate.  Recent and remote memory are intact.  Attention and concentration are normal.    Able to name objects and repeat phrases. Cranial nerves: CN I: not tested CN II: pupils equal, round and reactive to light, visual fields intact, fundi unremarkable. CN III, IV, VI:  full range of motion, no nystagmus, no ptosis CN V: facial sensation intact CN VII: upper and lower face symmetric CN VIII: hearing intact to finger rub CN IX, X: gag intact, uvula midline CN XI: sternocleidomastoid and trapezius muscles intact CN XII: tongue midline Bulk & Tone: normal, no fasciculations. Motor: 5/5 throughout with no pronator drift. Sensation: intact to light touch, cold, pin on both UE and LE, decreased vibration to ankles bilaterally, intact joint position sense.  No extinction to double simultaneous stimulation.  Romberg test positive sway Deep Tendon Reflexes: +2 throughout except absent ankle jerks, no ankle clonus Plantar responses: downgoing bilaterally Cerebellar: no incoordination on finger to nose, heel to shin. No dysdiadochokinesia Gait: narrow-based and steady, able to tandem walk adequately. Tremor: none  IMPRESSION: This is a pleasant 57 year old right-handed woman with a history of  hypertension, chronic neck and back pain, urinary incontinence with recurrent UTIs, presenting for evaluation of dizziness and paresthesias. Her neurological exam is non-focal, there is evidence of mild length-dependent neuropathy, which is likely the cause of distal symmetric paresthesias, this can also cause a sense of imbalance when walking. Neuropathy labs and EMG/NCV of the left UE and LE will be  ordered to further evaluate her symptoms. If paresthesias worsen, she may try increasing dose of gabapentin (night time dose can be increased first due to side effects she previously reported on TID dosing). She is also reporting 2 episodes of significant dizziness/lightheadedness with nausea suggestive of orthostatic hypotension, there is note of a 20-pt drop in systolic and 10-pt drop in diastolic BP from sitting to standing today, she was advised to discuss BP medication adjustment with PCP, increase fluid hydration, and liberalize salt intake. She may benefit from an abdominal binder if these increase in frequency. Follow-up in 6 months, she knows to call for any changes.   Thank you for allowing me to participate in the care of this patient. Please do not hesitate to call for any questions or concerns.   Patrcia Dolly, M.D.  CC: Dr. Wynelle Link

## 2018-10-04 NOTE — Patient Instructions (Signed)
1. Bloodwork from Dr. Wynelle Link will be requested for review, and we will let you know of additional labs needed  2. Schedule EMG/NCV of the left UE and LE with Dr. Allena Katz  3. You can try increasing the gabapentin 100mg : take 1 cap in AM, 2 caps in PM and see if this helps with symptoms  4. Follow-up in 6 months, call for any changes

## 2018-10-15 DIAGNOSIS — R399 Unspecified symptoms and signs involving the genitourinary system: Secondary | ICD-10-CM | POA: Diagnosis not present

## 2018-10-31 DIAGNOSIS — E663 Overweight: Secondary | ICD-10-CM | POA: Diagnosis not present

## 2018-10-31 DIAGNOSIS — Z6829 Body mass index (BMI) 29.0-29.9, adult: Secondary | ICD-10-CM | POA: Diagnosis not present

## 2018-11-05 ENCOUNTER — Ambulatory Visit (INDEPENDENT_AMBULATORY_CARE_PROVIDER_SITE_OTHER): Payer: BLUE CROSS/BLUE SHIELD | Admitting: Neurology

## 2018-11-05 DIAGNOSIS — R42 Dizziness and giddiness: Secondary | ICD-10-CM | POA: Diagnosis not present

## 2018-11-05 DIAGNOSIS — G5602 Carpal tunnel syndrome, left upper limb: Secondary | ICD-10-CM

## 2018-11-05 NOTE — Procedures (Signed)
[facility]  [address], [address]  [place], [place] [address] Tel: [phone] Fax:  [phone] Test Date:  11/05/2018  Patient: Carla MouldsMiriam Frank DOB: 03/08/1961 Physician: Nita Sickleonika Naarah Borgerding, DO  Sex: Female Height: 5\' 5"  Ref Phys: Patrcia DollyKaren Aquino, M.D.  ID#: [id number] Temp: 33.0C Technician:    Patient Complaints: This is a 57 year-old female with history of bilateral CTS release referred for evaluation of generalized numbness/tingling of the hands and feet.  NCV & EMG Findings: Extensive electrodiagnostic testing of the left upper and lower extremity shows:  1. Left median sensory response shows prolonged distal peak latency (4.2 ms).  Left ulnar, sural, and superficial peroneal sensory responses are within normal limits. 2. Left median motor nerve shows reduced amplitude (5.8 mV).  Left ulnar, peroneal, and tibial motor responses are within normal limits.   3. Left tibial each reflex study is within normal limits.   4. Chronic motor axonal loss changes are isolated to the left abductor pollicis brevis muscle, without accompanied active denervation.  The remaining tested muscles showed normal motor unit configuration and recruitment pattern.    Impression: 1. Left median neuropathy at or distal to the wrist, consistent with a clinical diagnosis of carpal tunnel syndrome.  Overall, these findings are moderate degree electrically.   2. There is no evidence of a sensorimotor polyneuropathy or cervical/lumbosacral radiculopathy affecting the left side.    ___________________________ Nita Sickleonika Lianny Molter, DO    Nerve Conduction Studies Anti Sensory Summary Table   Stim Site NR Peak (ms) Norm Peak (ms) P-T Amp (V) Norm P-T Amp  Left Median Anti Sensory (2nd Digit)  Wrist    4.2 <3.6 33.3 >15  Left Sup Peroneal Anti Sensory (Ant Lat Mall)  33C  12 cm    2.6 <4.6 14.6 >4  Left Sural Anti Sensory (Lat Mall)  33C  Calf    3.1 <4.6 13.5 >4  Left Ulnar Anti Sensory (5th Digit)   33C  Wrist    3.0 <3.1 42.6 >10   Motor Summary Table   Stim Site NR Onset (ms) Norm Onset (ms) O-P Amp (mV) Norm O-P Amp Site1 Site2 Delta-0 (ms) Dist (cm) Vel (m/s) Norm Vel (m/s)  Left Median Motor (Abd Poll Brev)  33C  Wrist    4.0 <4.0 5.8 >6 Elbow Wrist 5.3 28.0 53 >50  Elbow    9.3  5.7         Left Peroneal Motor (Ext Dig Brev)  33C  Ankle    3.8 <6.0 4.5 >2.5 B Fib Ankle 7.1 38.0 54 >40  B Fib    10.9  4.3  Poplt B Fib 1.5 8.0 53 >40  Poplt    12.4  4.0         Left Tibial Motor (Abd Hall Brev)  33C  Ankle    3.2 <6.0 5.9 >4 Knee Ankle 9.1 39.0 43 >40  Knee    12.3  5.9         Left Ulnar Motor (Abd Dig Minimi)  33C  Wrist    3.0 <3.1 7.2 >7 B Elbow Wrist 3.6 23.0 64 >50  B Elbow    6.6  6.6  A Elbow B Elbow 1.8 10.0 56 >50  A Elbow    8.4  6.5          H Reflex Studies   NR H-Lat (ms) Lat Norm (ms) L-R H-Lat (ms)  Left Tibial (Gastroc)  33C     31.97 <35  EMG   Side Muscle Ins Act Fibs Psw Fasc Number Recrt Dur Dur. Amp Amp. Poly Poly. Comment  Left AntTibialis Nml Nml Nml Nml Nml Nml Nml Nml Nml Nml Nml Nml N/A  Left Gastroc Nml Nml Nml Nml Nml Nml Nml Nml Nml Nml Nml Nml N/A  Left Flex Dig Long Nml Nml Nml Nml Nml Nml Nml Nml Nml Nml Nml Nml N/A  Left RectFemoris Nml Nml Nml Nml Nml Nml Nml Nml Nml Nml Nml Nml N/A  Left GluteusMed Nml Nml Nml Nml Nml Nml Nml Nml Nml Nml Nml Nml N/A  Left 1stDorInt Nml Nml Nml Nml Nml Nml Nml Nml Nml Nml Nml Nml N/A  Left Abd Poll Brev Nml Nml Nml Nml 1- Rapid Some 1+ Some 1+ Some 1+ N/A  Left Ext Indicis Nml Nml Nml Nml Nml Nml Nml Nml Nml Nml Nml Nml N/A  Left PronatorTeres Nml Nml Nml Nml Nml Nml Nml Nml Nml Nml Nml Nml N/A  Left Triceps Nml Nml Nml Nml Nml Nml Nml Nml Nml Nml Nml Nml N/A  Left Deltoid Nml Nml Nml Nml Nml Nml Nml Nml Nml Nml Nml Nml N/A      Waveforms:

## 2018-11-06 ENCOUNTER — Telehealth: Payer: Self-pay | Admitting: *Deleted

## 2018-11-06 NOTE — Telephone Encounter (Signed)
-----   Message from [place] Chardonika K Patel, DO sent at 11/05/2018  4:36 PM EST ----- Please inform patient that her nerve testing show residual left CTS which can cause hand numbness/tingling.  Her nerve testing of the left leg is normal - no neuropathy or nerve impingement from the back.  Dr. Karel JarvisAquino will decide if any additional testing is needed. Thanks.

## 2018-11-06 NOTE — Telephone Encounter (Signed)
Left message giving patient results.  Informed her that Dr. Karel JarvisAquino will let her know if any additional testing is needed.

## 2018-11-13 DIAGNOSIS — Z23 Encounter for immunization: Secondary | ICD-10-CM | POA: Diagnosis not present

## 2018-11-28 DIAGNOSIS — M1612 Unilateral primary osteoarthritis, left hip: Secondary | ICD-10-CM | POA: Diagnosis not present

## 2018-11-28 DIAGNOSIS — Z471 Aftercare following joint replacement surgery: Secondary | ICD-10-CM | POA: Diagnosis not present

## 2018-11-28 DIAGNOSIS — Z96641 Presence of right artificial hip joint: Secondary | ICD-10-CM | POA: Diagnosis not present

## 2018-12-02 DIAGNOSIS — E663 Overweight: Secondary | ICD-10-CM | POA: Diagnosis not present

## 2018-12-02 DIAGNOSIS — Z6829 Body mass index (BMI) 29.0-29.9, adult: Secondary | ICD-10-CM | POA: Diagnosis not present

## 2018-12-23 DIAGNOSIS — R399 Unspecified symptoms and signs involving the genitourinary system: Secondary | ICD-10-CM | POA: Diagnosis not present

## 2019-02-05 DIAGNOSIS — M5030 Other cervical disc degeneration, unspecified cervical region: Secondary | ICD-10-CM | POA: Diagnosis not present

## 2019-02-05 DIAGNOSIS — I10 Essential (primary) hypertension: Secondary | ICD-10-CM | POA: Diagnosis not present

## 2019-02-05 DIAGNOSIS — E7800 Pure hypercholesterolemia, unspecified: Secondary | ICD-10-CM | POA: Diagnosis not present

## 2019-02-05 DIAGNOSIS — B0229 Other postherpetic nervous system involvement: Secondary | ICD-10-CM | POA: Diagnosis not present

## 2019-02-19 DIAGNOSIS — Z1231 Encounter for screening mammogram for malignant neoplasm of breast: Secondary | ICD-10-CM | POA: Diagnosis not present

## 2019-02-19 DIAGNOSIS — Z01419 Encounter for gynecological examination (general) (routine) without abnormal findings: Secondary | ICD-10-CM | POA: Diagnosis not present

## 2019-02-19 DIAGNOSIS — Z6828 Body mass index (BMI) 28.0-28.9, adult: Secondary | ICD-10-CM | POA: Diagnosis not present

## 2019-02-20 DIAGNOSIS — E669 Obesity, unspecified: Secondary | ICD-10-CM | POA: Diagnosis not present

## 2019-02-20 DIAGNOSIS — Z6828 Body mass index (BMI) 28.0-28.9, adult: Secondary | ICD-10-CM | POA: Diagnosis not present

## 2019-03-17 DIAGNOSIS — R399 Unspecified symptoms and signs involving the genitourinary system: Secondary | ICD-10-CM | POA: Diagnosis not present

## 2019-04-29 DIAGNOSIS — R399 Unspecified symptoms and signs involving the genitourinary system: Secondary | ICD-10-CM | POA: Diagnosis not present

## 2019-04-30 DIAGNOSIS — Z01818 Encounter for other preprocedural examination: Secondary | ICD-10-CM | POA: Diagnosis not present

## 2019-05-06 DIAGNOSIS — N390 Urinary tract infection, site not specified: Secondary | ICD-10-CM | POA: Diagnosis not present

## 2019-05-12 DIAGNOSIS — R399 Unspecified symptoms and signs involving the genitourinary system: Secondary | ICD-10-CM | POA: Diagnosis not present

## 2019-05-15 ENCOUNTER — Ambulatory Visit: Payer: BLUE CROSS/BLUE SHIELD | Admitting: Neurology

## 2019-05-20 DIAGNOSIS — M1612 Unilateral primary osteoarthritis, left hip: Secondary | ICD-10-CM | POA: Diagnosis not present

## 2019-06-24 DIAGNOSIS — Z471 Aftercare following joint replacement surgery: Secondary | ICD-10-CM | POA: Diagnosis not present

## 2019-06-24 DIAGNOSIS — Z96642 Presence of left artificial hip joint: Secondary | ICD-10-CM | POA: Diagnosis not present

## 2019-07-03 IMAGING — CR DG CHEST 2V
2 series · 2 of 2 positions shown · non-contrast
Comparison: Chest x-ray dated 07/08/2014.

CLINICAL DATA: Chest heaviness, tingling in fingers and toes since
last night there

EXAM:
CHEST  2 VIEW

[chest pa]
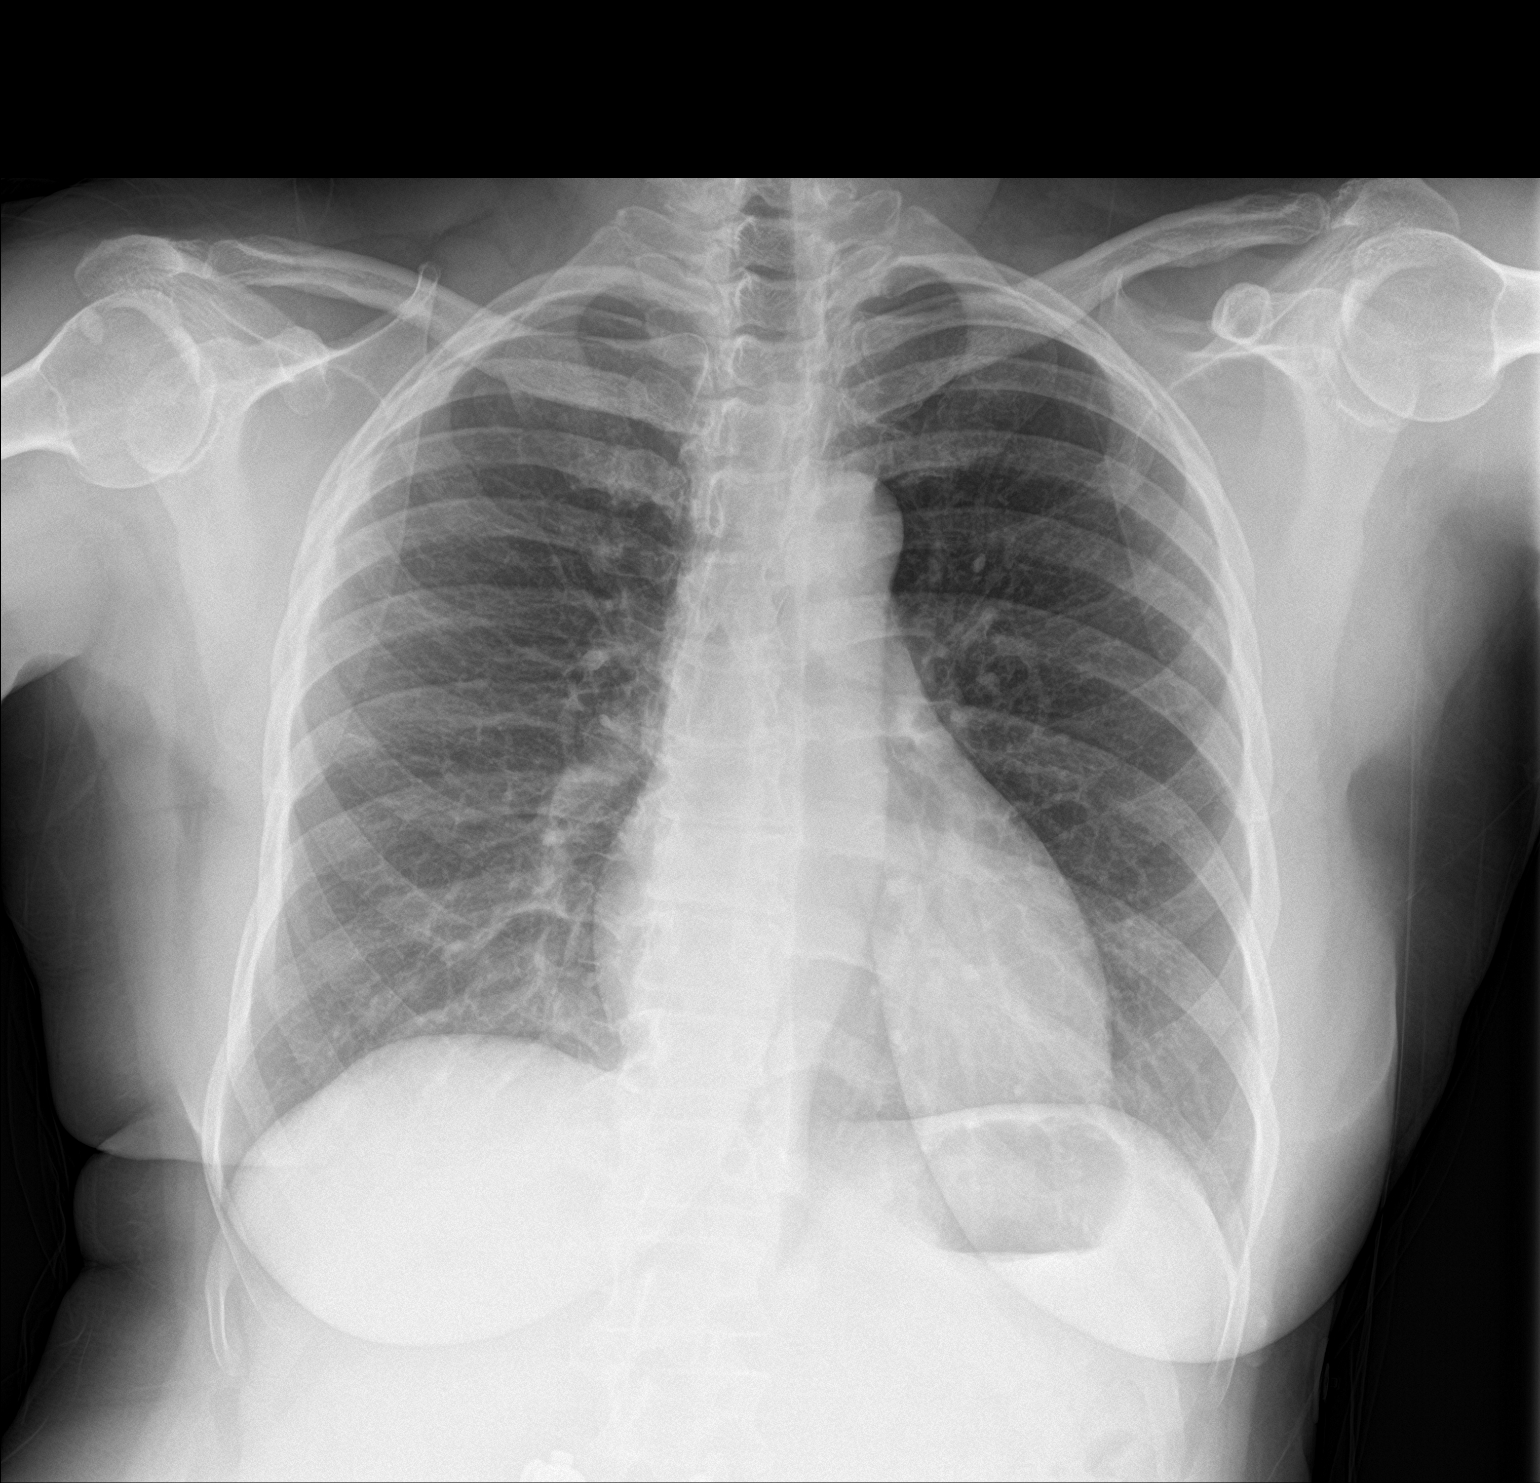

[chest lat]
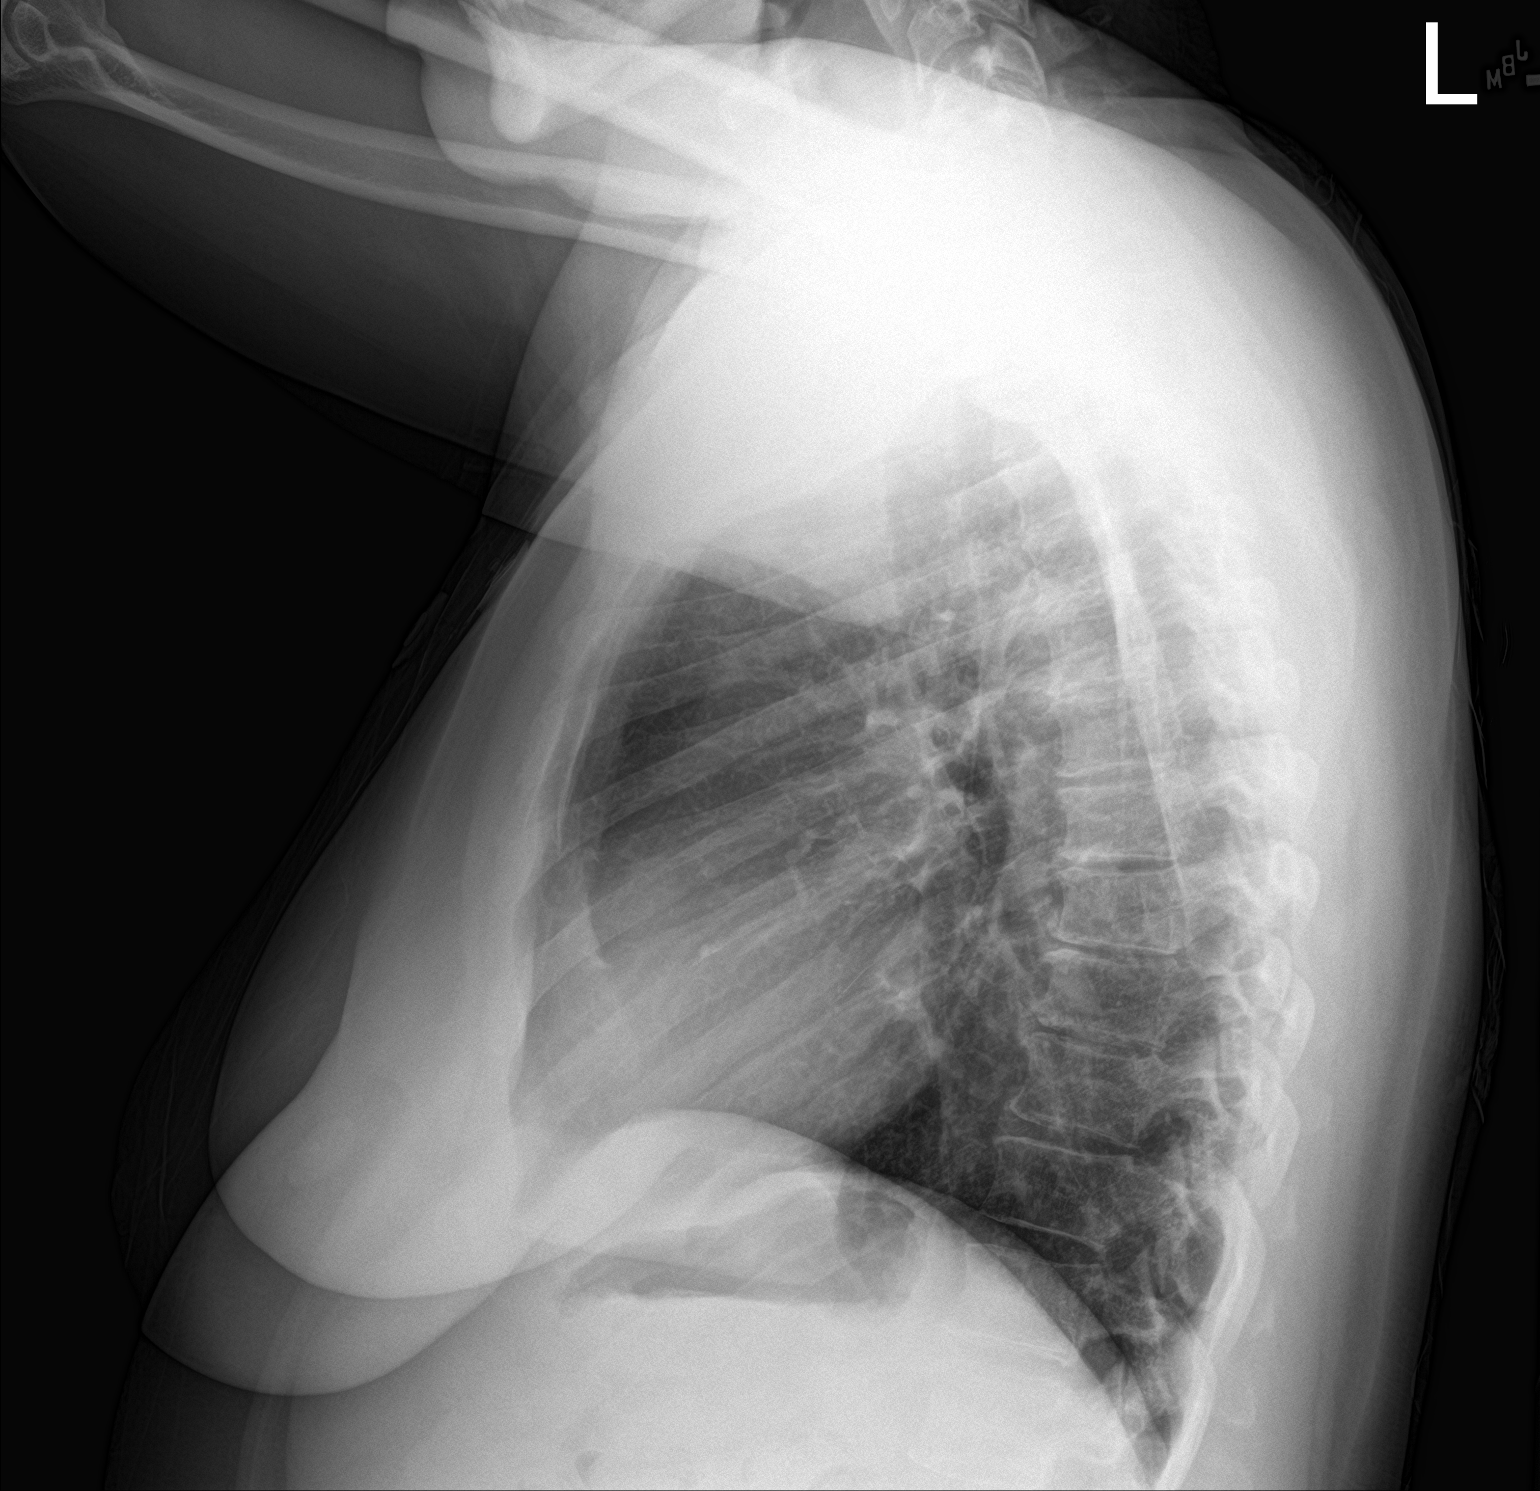

[2 of 2 positions shown; findings below may reference images not displayed]

FINDINGS: The heart size and mediastinal contours are within normal limits.
Both lungs are clear. Mild degenerative spurring within the thoracic
spine. No acute or suspicious osseous finding.
IMPRESSION: No active cardiopulmonary disease. No evidence of pneumonia or
pulmonary edema.

## 2019-07-25 DIAGNOSIS — R399 Unspecified symptoms and signs involving the genitourinary system: Secondary | ICD-10-CM | POA: Diagnosis not present

## 2019-09-25 DIAGNOSIS — R3129 Other microscopic hematuria: Secondary | ICD-10-CM | POA: Diagnosis not present

## 2019-09-25 DIAGNOSIS — N390 Urinary tract infection, site not specified: Secondary | ICD-10-CM | POA: Diagnosis not present

## 2019-09-25 DIAGNOSIS — N393 Stress incontinence (female) (male): Secondary | ICD-10-CM | POA: Diagnosis not present

## 2019-10-10 DIAGNOSIS — N393 Stress incontinence (female) (male): Secondary | ICD-10-CM | POA: Diagnosis not present

## 2019-10-10 DIAGNOSIS — Z20828 Contact with and (suspected) exposure to other viral communicable diseases: Secondary | ICD-10-CM | POA: Diagnosis not present

## 2019-10-10 DIAGNOSIS — Z01812 Encounter for preprocedural laboratory examination: Secondary | ICD-10-CM | POA: Diagnosis not present

## 2019-10-13 DIAGNOSIS — R399 Unspecified symptoms and signs involving the genitourinary system: Secondary | ICD-10-CM | POA: Diagnosis not present

## 2019-10-17 DIAGNOSIS — Z9889 Other specified postprocedural states: Secondary | ICD-10-CM | POA: Diagnosis not present

## 2019-10-17 DIAGNOSIS — N393 Stress incontinence (female) (male): Secondary | ICD-10-CM | POA: Diagnosis not present

## 2019-10-28 DIAGNOSIS — G4700 Insomnia, unspecified: Secondary | ICD-10-CM | POA: Diagnosis not present

## 2019-10-28 DIAGNOSIS — M5030 Other cervical disc degeneration, unspecified cervical region: Secondary | ICD-10-CM | POA: Diagnosis not present

## 2019-10-28 DIAGNOSIS — B0229 Other postherpetic nervous system involvement: Secondary | ICD-10-CM | POA: Diagnosis not present

## 2019-10-28 DIAGNOSIS — I10 Essential (primary) hypertension: Secondary | ICD-10-CM | POA: Diagnosis not present

## 2019-11-27 DIAGNOSIS — R399 Unspecified symptoms and signs involving the genitourinary system: Secondary | ICD-10-CM | POA: Diagnosis not present

## 2019-11-27 DIAGNOSIS — N393 Stress incontinence (female) (male): Secondary | ICD-10-CM | POA: Diagnosis not present

## 2019-11-27 DIAGNOSIS — N390 Urinary tract infection, site not specified: Secondary | ICD-10-CM | POA: Diagnosis not present

## 2019-12-03 IMAGING — CR DG CHEST 2V
2 series · 2 of 2 positions shown · non-contrast
Comparison: 09/17/2017

CLINICAL DATA: Palpitation

EXAM:
CHEST  2 VIEW

[w chest pa]
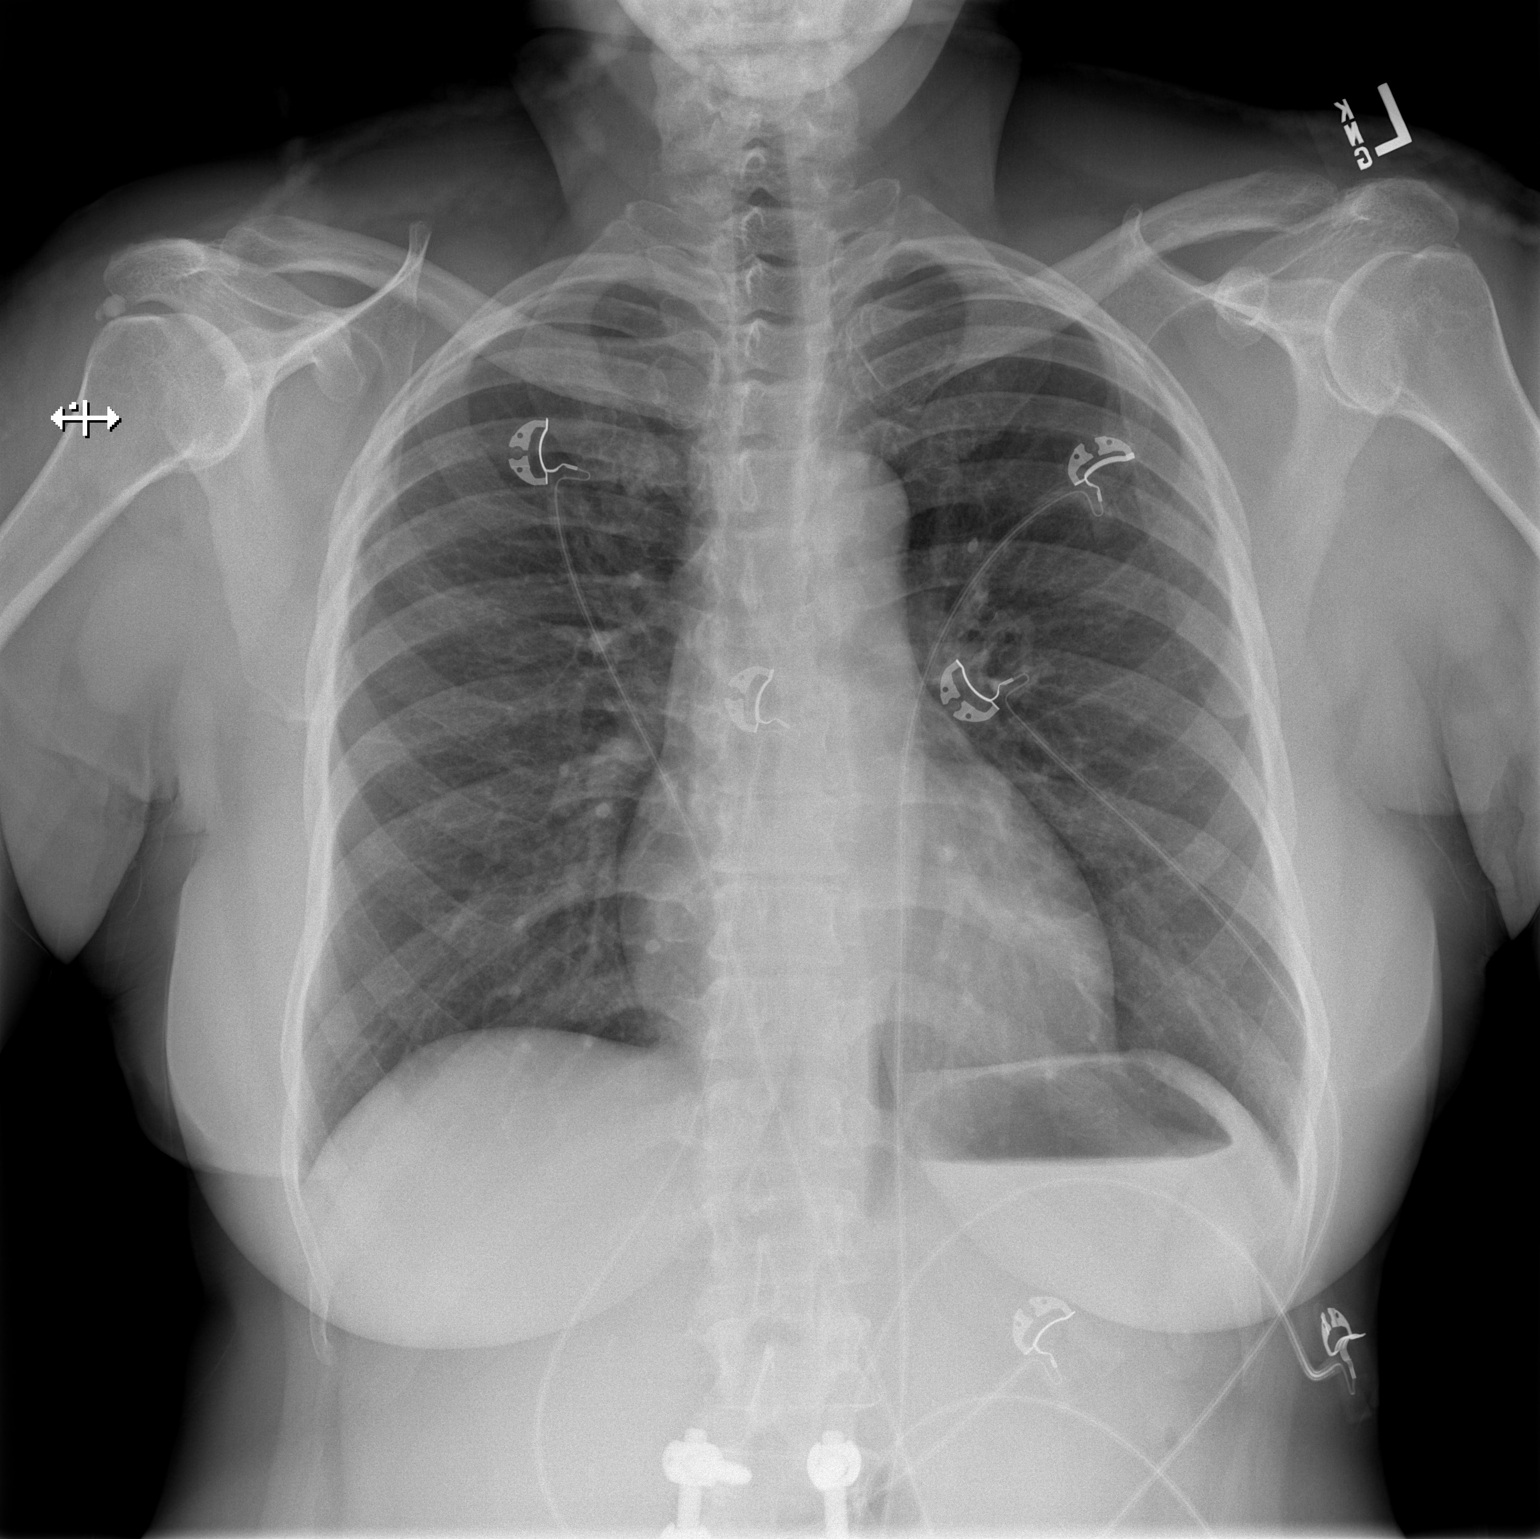

[w chest lat]
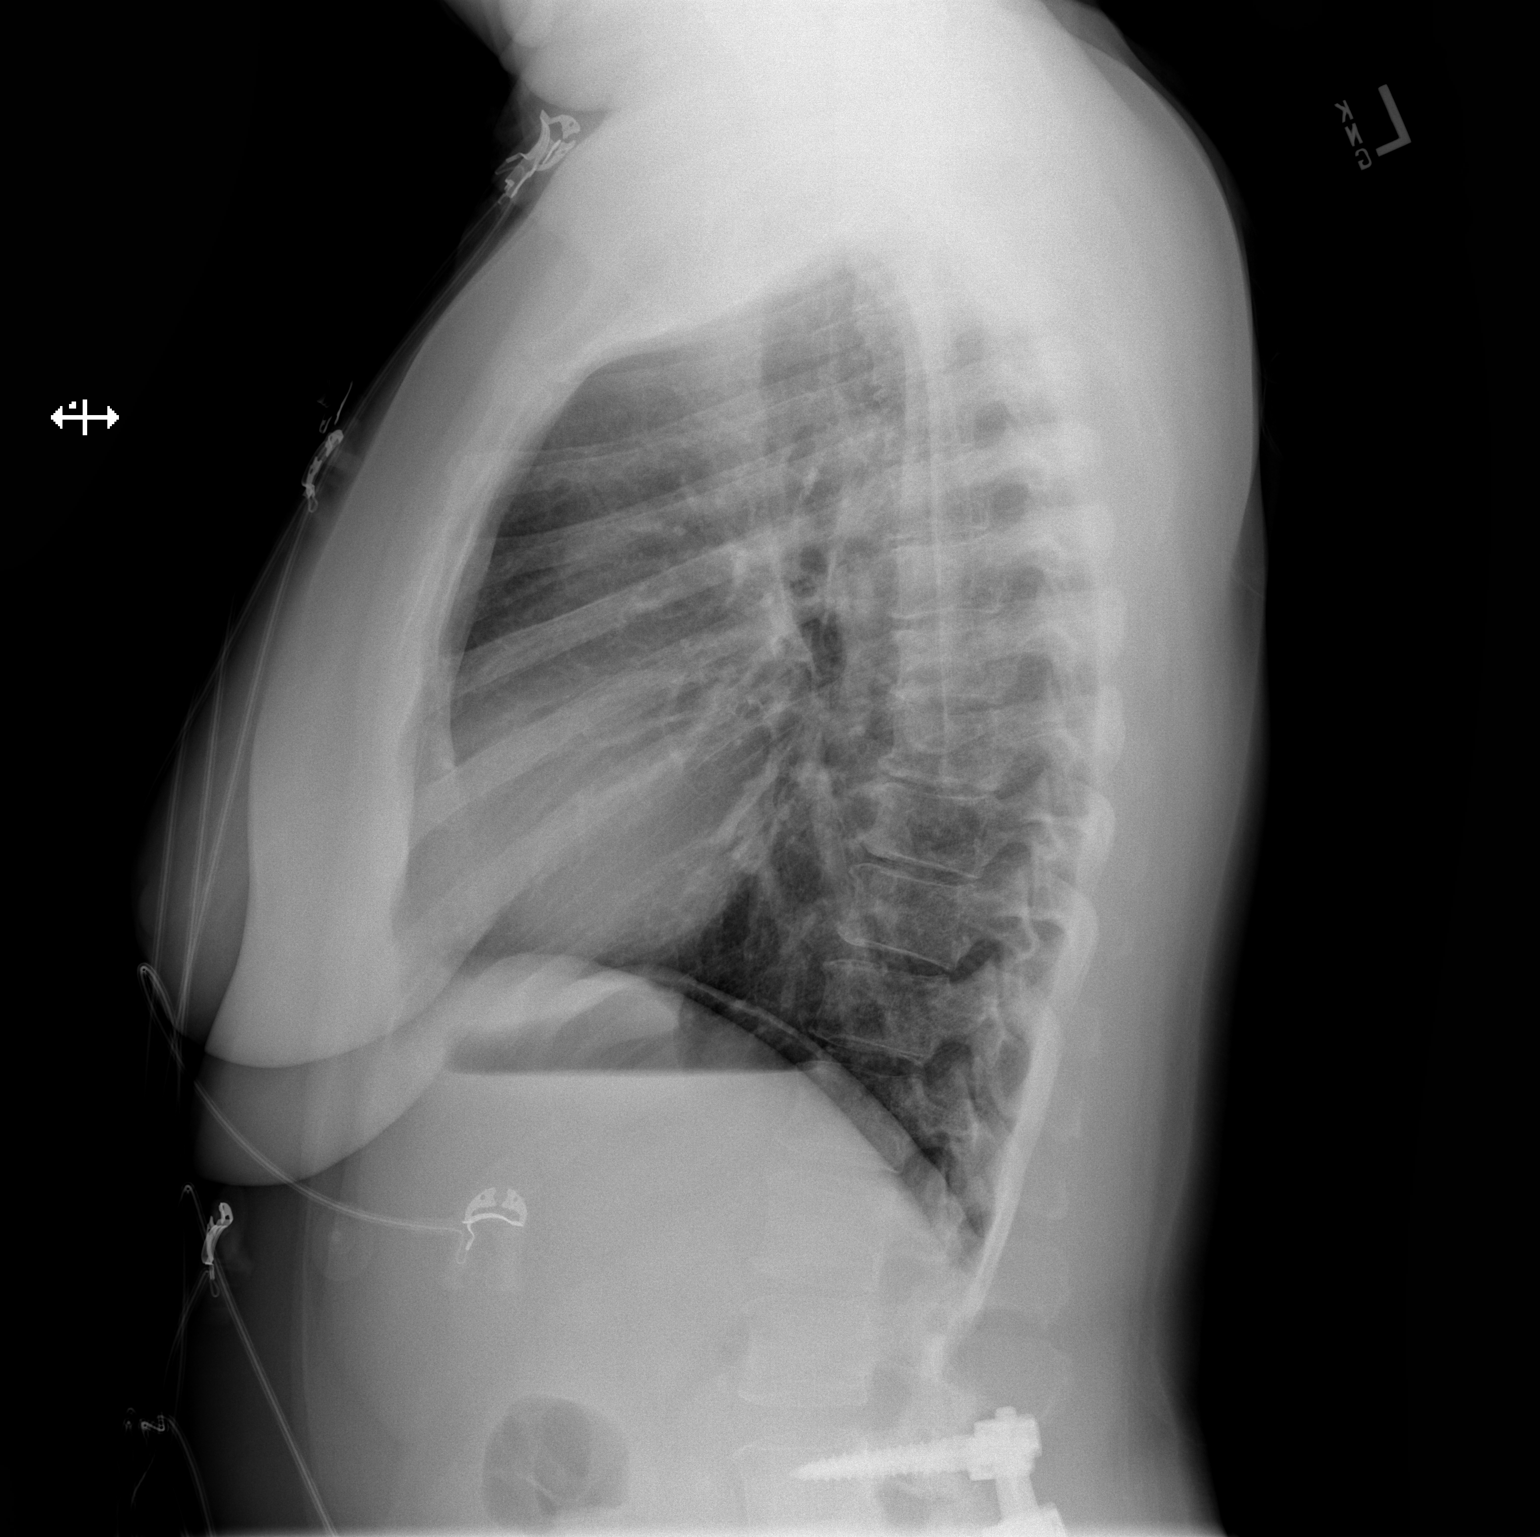

[2 of 2 positions shown; findings below may reference images not displayed]

FINDINGS: The heart size and mediastinal contours are within normal limits.
Both lungs are clear. Calcific tendinitis right shoulder. Partially
visible hardware in the spine.
IMPRESSION: No active cardiopulmonary disease.

## 2020-01-08 DIAGNOSIS — N393 Stress incontinence (female) (male): Secondary | ICD-10-CM | POA: Diagnosis not present

## 2020-01-19 DIAGNOSIS — R399 Unspecified symptoms and signs involving the genitourinary system: Secondary | ICD-10-CM | POA: Diagnosis not present

## 2020-03-15 DIAGNOSIS — R399 Unspecified symptoms and signs involving the genitourinary system: Secondary | ICD-10-CM | POA: Diagnosis not present

## 2020-03-25 DIAGNOSIS — H6123 Impacted cerumen, bilateral: Secondary | ICD-10-CM | POA: Diagnosis not present

## 2020-03-25 DIAGNOSIS — H900 Conductive hearing loss, bilateral: Secondary | ICD-10-CM | POA: Diagnosis not present

## 2020-04-27 DIAGNOSIS — Z1231 Encounter for screening mammogram for malignant neoplasm of breast: Secondary | ICD-10-CM | POA: Diagnosis not present

## 2020-04-27 DIAGNOSIS — Z6831 Body mass index (BMI) 31.0-31.9, adult: Secondary | ICD-10-CM | POA: Diagnosis not present

## 2020-04-27 DIAGNOSIS — Z01419 Encounter for gynecological examination (general) (routine) without abnormal findings: Secondary | ICD-10-CM | POA: Diagnosis not present

## 2020-05-06 DIAGNOSIS — Z6831 Body mass index (BMI) 31.0-31.9, adult: Secondary | ICD-10-CM | POA: Diagnosis not present

## 2020-05-06 DIAGNOSIS — E669 Obesity, unspecified: Secondary | ICD-10-CM | POA: Diagnosis not present

## 2020-05-13 DIAGNOSIS — M25561 Pain in right knee: Secondary | ICD-10-CM | POA: Diagnosis not present

## 2020-05-13 DIAGNOSIS — Z96642 Presence of left artificial hip joint: Secondary | ICD-10-CM | POA: Diagnosis not present

## 2020-05-13 DIAGNOSIS — Z96641 Presence of right artificial hip joint: Secondary | ICD-10-CM | POA: Diagnosis not present

## 2020-05-25 DIAGNOSIS — M25561 Pain in right knee: Secondary | ICD-10-CM | POA: Diagnosis not present

## 2020-06-04 DIAGNOSIS — R399 Unspecified symptoms and signs involving the genitourinary system: Secondary | ICD-10-CM | POA: Diagnosis not present

## 2020-06-16 DIAGNOSIS — I10 Essential (primary) hypertension: Secondary | ICD-10-CM | POA: Diagnosis not present

## 2020-06-16 DIAGNOSIS — M5030 Other cervical disc degeneration, unspecified cervical region: Secondary | ICD-10-CM | POA: Diagnosis not present

## 2020-06-16 DIAGNOSIS — B0229 Other postherpetic nervous system involvement: Secondary | ICD-10-CM | POA: Diagnosis not present

## 2020-06-16 DIAGNOSIS — G4700 Insomnia, unspecified: Secondary | ICD-10-CM | POA: Diagnosis not present

## 2020-06-18 DIAGNOSIS — M25561 Pain in right knee: Secondary | ICD-10-CM | POA: Diagnosis not present

## 2020-07-13 DIAGNOSIS — K219 Gastro-esophageal reflux disease without esophagitis: Secondary | ICD-10-CM | POA: Diagnosis not present

## 2020-07-13 DIAGNOSIS — R198 Other specified symptoms and signs involving the digestive system and abdomen: Secondary | ICD-10-CM | POA: Diagnosis not present

## 2020-07-19 DIAGNOSIS — Z1211 Encounter for screening for malignant neoplasm of colon: Secondary | ICD-10-CM | POA: Diagnosis not present

## 2020-08-04 DIAGNOSIS — Z20822 Contact with and (suspected) exposure to covid-19: Secondary | ICD-10-CM | POA: Diagnosis not present

## 2020-09-02 DIAGNOSIS — R399 Unspecified symptoms and signs involving the genitourinary system: Secondary | ICD-10-CM | POA: Diagnosis not present

## 2020-09-03 DIAGNOSIS — R140 Abdominal distension (gaseous): Secondary | ICD-10-CM | POA: Diagnosis not present

## 2020-09-03 DIAGNOSIS — K219 Gastro-esophageal reflux disease without esophagitis: Secondary | ICD-10-CM | POA: Diagnosis not present

## 2020-09-28 DIAGNOSIS — M545 Low back pain, unspecified: Secondary | ICD-10-CM | POA: Diagnosis not present

## 2020-10-04 DIAGNOSIS — N393 Stress incontinence (female) (male): Secondary | ICD-10-CM | POA: Diagnosis not present

## 2020-10-04 DIAGNOSIS — N390 Urinary tract infection, site not specified: Secondary | ICD-10-CM | POA: Diagnosis not present

## 2020-10-04 DIAGNOSIS — M545 Low back pain, unspecified: Secondary | ICD-10-CM | POA: Diagnosis not present

## 2020-10-08 DIAGNOSIS — M545 Low back pain, unspecified: Secondary | ICD-10-CM | POA: Diagnosis not present

## 2020-10-11 DIAGNOSIS — M545 Low back pain, unspecified: Secondary | ICD-10-CM | POA: Diagnosis not present

## 2020-10-13 DIAGNOSIS — M545 Low back pain, unspecified: Secondary | ICD-10-CM | POA: Diagnosis not present

## 2020-10-18 DIAGNOSIS — M545 Low back pain, unspecified: Secondary | ICD-10-CM | POA: Diagnosis not present

## 2020-10-22 DIAGNOSIS — M545 Low back pain, unspecified: Secondary | ICD-10-CM | POA: Diagnosis not present

## 2020-10-25 DIAGNOSIS — R300 Dysuria: Secondary | ICD-10-CM | POA: Diagnosis not present

## 2020-10-27 DIAGNOSIS — M545 Low back pain, unspecified: Secondary | ICD-10-CM | POA: Diagnosis not present

## 2020-10-29 DIAGNOSIS — M545 Low back pain, unspecified: Secondary | ICD-10-CM | POA: Diagnosis not present

## 2020-11-03 DIAGNOSIS — M545 Low back pain, unspecified: Secondary | ICD-10-CM | POA: Diagnosis not present

## 2020-11-05 DIAGNOSIS — M545 Low back pain, unspecified: Secondary | ICD-10-CM | POA: Diagnosis not present

## 2020-11-16 DIAGNOSIS — M545 Low back pain, unspecified: Secondary | ICD-10-CM | POA: Diagnosis not present

## 2020-11-22 DIAGNOSIS — R222 Localized swelling, mass and lump, trunk: Secondary | ICD-10-CM | POA: Diagnosis not present

## 2020-11-22 DIAGNOSIS — N390 Urinary tract infection, site not specified: Secondary | ICD-10-CM | POA: Diagnosis not present

## 2020-11-29 DIAGNOSIS — M545 Low back pain, unspecified: Secondary | ICD-10-CM | POA: Diagnosis not present

## 2020-12-10 DIAGNOSIS — M545 Low back pain, unspecified: Secondary | ICD-10-CM | POA: Diagnosis not present

## 2020-12-28 DIAGNOSIS — Z20822 Contact with and (suspected) exposure to covid-19: Secondary | ICD-10-CM | POA: Diagnosis not present

## 2021-01-05 DIAGNOSIS — M545 Low back pain, unspecified: Secondary | ICD-10-CM | POA: Diagnosis not present

## 2021-01-11 DIAGNOSIS — M5459 Other low back pain: Secondary | ICD-10-CM | POA: Diagnosis not present

## 2021-01-25 DIAGNOSIS — M5416 Radiculopathy, lumbar region: Secondary | ICD-10-CM | POA: Diagnosis not present

## 2021-01-31 DIAGNOSIS — M5030 Other cervical disc degeneration, unspecified cervical region: Secondary | ICD-10-CM | POA: Diagnosis not present

## 2021-01-31 DIAGNOSIS — G4700 Insomnia, unspecified: Secondary | ICD-10-CM | POA: Diagnosis not present

## 2021-01-31 DIAGNOSIS — I10 Essential (primary) hypertension: Secondary | ICD-10-CM | POA: Diagnosis not present

## 2021-04-05 DIAGNOSIS — R399 Unspecified symptoms and signs involving the genitourinary system: Secondary | ICD-10-CM | POA: Diagnosis not present

## 2021-04-29 DIAGNOSIS — H6123 Impacted cerumen, bilateral: Secondary | ICD-10-CM | POA: Diagnosis not present

## 2021-05-03 DIAGNOSIS — E7800 Pure hypercholesterolemia, unspecified: Secondary | ICD-10-CM | POA: Diagnosis not present

## 2021-05-13 DIAGNOSIS — M545 Low back pain, unspecified: Secondary | ICD-10-CM | POA: Diagnosis not present

## 2021-05-30 DIAGNOSIS — R350 Frequency of micturition: Secondary | ICD-10-CM | POA: Diagnosis not present

## 2021-05-30 DIAGNOSIS — L989 Disorder of the skin and subcutaneous tissue, unspecified: Secondary | ICD-10-CM | POA: Diagnosis not present

## 2021-05-30 DIAGNOSIS — Z90710 Acquired absence of both cervix and uterus: Secondary | ICD-10-CM | POA: Diagnosis not present

## 2021-05-30 DIAGNOSIS — Z1231 Encounter for screening mammogram for malignant neoplasm of breast: Secondary | ICD-10-CM | POA: Diagnosis not present

## 2021-05-30 DIAGNOSIS — Z01419 Encounter for gynecological examination (general) (routine) without abnormal findings: Secondary | ICD-10-CM | POA: Diagnosis not present

## 2021-05-30 DIAGNOSIS — Z780 Asymptomatic menopausal state: Secondary | ICD-10-CM | POA: Diagnosis not present

## 2021-06-11 DIAGNOSIS — M5416 Radiculopathy, lumbar region: Secondary | ICD-10-CM | POA: Diagnosis not present

## 2021-07-06 DIAGNOSIS — R399 Unspecified symptoms and signs involving the genitourinary system: Secondary | ICD-10-CM | POA: Diagnosis not present

## 2021-07-07 DIAGNOSIS — M545 Low back pain, unspecified: Secondary | ICD-10-CM | POA: Diagnosis not present

## 2021-07-07 DIAGNOSIS — Z780 Asymptomatic menopausal state: Secondary | ICD-10-CM | POA: Diagnosis not present

## 2021-07-15 DIAGNOSIS — H35411 Lattice degeneration of retina, right eye: Secondary | ICD-10-CM | POA: Diagnosis not present

## 2021-07-19 DIAGNOSIS — H43811 Vitreous degeneration, right eye: Secondary | ICD-10-CM | POA: Diagnosis not present

## 2021-07-19 DIAGNOSIS — H3540 Unspecified peripheral retinal degeneration: Secondary | ICD-10-CM | POA: Diagnosis not present

## 2021-07-19 DIAGNOSIS — H43822 Vitreomacular adhesion, left eye: Secondary | ICD-10-CM | POA: Diagnosis not present

## 2021-07-19 DIAGNOSIS — D3132 Benign neoplasm of left choroid: Secondary | ICD-10-CM | POA: Diagnosis not present

## 2021-07-21 DIAGNOSIS — M545 Low back pain, unspecified: Secondary | ICD-10-CM | POA: Diagnosis not present

## 2021-08-12 DIAGNOSIS — U071 COVID-19: Secondary | ICD-10-CM | POA: Diagnosis not present

## 2021-10-03 DIAGNOSIS — Z23 Encounter for immunization: Secondary | ICD-10-CM | POA: Diagnosis not present

## 2021-10-03 DIAGNOSIS — D2261 Melanocytic nevi of right upper limb, including shoulder: Secondary | ICD-10-CM | POA: Diagnosis not present

## 2021-10-03 DIAGNOSIS — J018 Other acute sinusitis: Secondary | ICD-10-CM | POA: Diagnosis not present

## 2021-11-11 DIAGNOSIS — N390 Urinary tract infection, site not specified: Secondary | ICD-10-CM | POA: Diagnosis not present

## 2021-11-30 DIAGNOSIS — N390 Urinary tract infection, site not specified: Secondary | ICD-10-CM | POA: Diagnosis not present

## 2021-12-13 DIAGNOSIS — G4700 Insomnia, unspecified: Secondary | ICD-10-CM | POA: Diagnosis not present

## 2021-12-13 DIAGNOSIS — M5030 Other cervical disc degeneration, unspecified cervical region: Secondary | ICD-10-CM | POA: Diagnosis not present

## 2021-12-13 DIAGNOSIS — N390 Urinary tract infection, site not specified: Secondary | ICD-10-CM | POA: Diagnosis not present

## 2021-12-13 DIAGNOSIS — I10 Essential (primary) hypertension: Secondary | ICD-10-CM | POA: Diagnosis not present

## 2022-02-22 DIAGNOSIS — N393 Stress incontinence (female) (male): Secondary | ICD-10-CM | POA: Diagnosis not present

## 2022-02-22 DIAGNOSIS — Z87440 Personal history of urinary (tract) infections: Secondary | ICD-10-CM | POA: Diagnosis not present

## 2022-02-22 DIAGNOSIS — N3642 Intrinsic sphincter deficiency (ISD): Secondary | ICD-10-CM | POA: Diagnosis not present

## 2022-02-22 DIAGNOSIS — N952 Postmenopausal atrophic vaginitis: Secondary | ICD-10-CM | POA: Diagnosis not present

## 2022-03-21 DIAGNOSIS — J209 Acute bronchitis, unspecified: Secondary | ICD-10-CM | POA: Diagnosis not present

## 2022-05-20 DIAGNOSIS — R3989 Other symptoms and signs involving the genitourinary system: Secondary | ICD-10-CM | POA: Diagnosis not present

## 2022-05-20 DIAGNOSIS — R3129 Other microscopic hematuria: Secondary | ICD-10-CM | POA: Diagnosis not present

## 2022-05-20 DIAGNOSIS — N390 Urinary tract infection, site not specified: Secondary | ICD-10-CM | POA: Diagnosis not present

## 2022-05-30 DIAGNOSIS — Z87440 Personal history of urinary (tract) infections: Secondary | ICD-10-CM | POA: Diagnosis not present

## 2022-05-30 DIAGNOSIS — N390 Urinary tract infection, site not specified: Secondary | ICD-10-CM | POA: Diagnosis not present

## 2022-06-13 DIAGNOSIS — H6123 Impacted cerumen, bilateral: Secondary | ICD-10-CM | POA: Diagnosis not present

## 2022-06-13 DIAGNOSIS — I10 Essential (primary) hypertension: Secondary | ICD-10-CM | POA: Diagnosis not present

## 2022-06-13 DIAGNOSIS — M5030 Other cervical disc degeneration, unspecified cervical region: Secondary | ICD-10-CM | POA: Diagnosis not present

## 2022-06-13 DIAGNOSIS — G4700 Insomnia, unspecified: Secondary | ICD-10-CM | POA: Diagnosis not present

## 2022-06-13 DIAGNOSIS — N390 Urinary tract infection, site not specified: Secondary | ICD-10-CM | POA: Diagnosis not present

## 2022-06-16 DIAGNOSIS — R399 Unspecified symptoms and signs involving the genitourinary system: Secondary | ICD-10-CM | POA: Diagnosis not present

## 2022-06-16 DIAGNOSIS — N952 Postmenopausal atrophic vaginitis: Secondary | ICD-10-CM | POA: Diagnosis not present

## 2022-06-16 DIAGNOSIS — N3642 Intrinsic sphincter deficiency (ISD): Secondary | ICD-10-CM | POA: Diagnosis not present

## 2022-06-16 DIAGNOSIS — Z87440 Personal history of urinary (tract) infections: Secondary | ICD-10-CM | POA: Diagnosis not present

## 2022-06-22 DIAGNOSIS — Z1231 Encounter for screening mammogram for malignant neoplasm of breast: Secondary | ICD-10-CM | POA: Diagnosis not present

## 2022-06-22 DIAGNOSIS — I10 Essential (primary) hypertension: Secondary | ICD-10-CM | POA: Diagnosis not present

## 2022-06-22 DIAGNOSIS — Z90710 Acquired absence of both cervix and uterus: Secondary | ICD-10-CM | POA: Diagnosis not present

## 2022-06-22 DIAGNOSIS — Z01419 Encounter for gynecological examination (general) (routine) without abnormal findings: Secondary | ICD-10-CM | POA: Diagnosis not present

## 2022-11-13 DIAGNOSIS — I10 Essential (primary) hypertension: Secondary | ICD-10-CM | POA: Diagnosis not present

## 2022-11-13 DIAGNOSIS — Z1211 Encounter for screening for malignant neoplasm of colon: Secondary | ICD-10-CM | POA: Diagnosis not present

## 2022-11-19 DIAGNOSIS — Z1211 Encounter for screening for malignant neoplasm of colon: Secondary | ICD-10-CM | POA: Diagnosis not present

## 2022-12-21 DIAGNOSIS — N3000 Acute cystitis without hematuria: Secondary | ICD-10-CM | POA: Diagnosis not present

## 2023-01-12 DIAGNOSIS — M545 Low back pain, unspecified: Secondary | ICD-10-CM | POA: Diagnosis not present

## 2023-01-12 DIAGNOSIS — Z96641 Presence of right artificial hip joint: Secondary | ICD-10-CM | POA: Diagnosis not present

## 2023-01-12 DIAGNOSIS — M25561 Pain in right knee: Secondary | ICD-10-CM | POA: Diagnosis not present

## 2023-01-27 DIAGNOSIS — M5416 Radiculopathy, lumbar region: Secondary | ICD-10-CM | POA: Diagnosis not present

## 2023-02-08 DIAGNOSIS — M5416 Radiculopathy, lumbar region: Secondary | ICD-10-CM | POA: Diagnosis not present

## 2023-02-08 DIAGNOSIS — M48062 Spinal stenosis, lumbar region with neurogenic claudication: Secondary | ICD-10-CM | POA: Diagnosis not present

## 2023-02-16 DIAGNOSIS — J019 Acute sinusitis, unspecified: Secondary | ICD-10-CM | POA: Diagnosis not present

## 2023-04-13 DIAGNOSIS — E7800 Pure hypercholesterolemia, unspecified: Secondary | ICD-10-CM | POA: Diagnosis not present

## 2023-04-13 DIAGNOSIS — M5030 Other cervical disc degeneration, unspecified cervical region: Secondary | ICD-10-CM | POA: Diagnosis not present

## 2023-04-13 DIAGNOSIS — I10 Essential (primary) hypertension: Secondary | ICD-10-CM | POA: Diagnosis not present

## 2023-04-13 DIAGNOSIS — N390 Urinary tract infection, site not specified: Secondary | ICD-10-CM | POA: Diagnosis not present

## 2023-04-13 DIAGNOSIS — G4700 Insomnia, unspecified: Secondary | ICD-10-CM | POA: Diagnosis not present

## 2023-04-18 DIAGNOSIS — N390 Urinary tract infection, site not specified: Secondary | ICD-10-CM | POA: Diagnosis not present

## 2023-04-30 DIAGNOSIS — N393 Stress incontinence (female) (male): Secondary | ICD-10-CM | POA: Diagnosis not present

## 2023-04-30 DIAGNOSIS — N3000 Acute cystitis without hematuria: Secondary | ICD-10-CM | POA: Diagnosis not present

## 2023-04-30 DIAGNOSIS — R300 Dysuria: Secondary | ICD-10-CM | POA: Diagnosis not present

## 2023-05-28 DIAGNOSIS — N393 Stress incontinence (female) (male): Secondary | ICD-10-CM | POA: Diagnosis not present

## 2023-06-20 DIAGNOSIS — M79675 Pain in left toe(s): Secondary | ICD-10-CM | POA: Diagnosis not present

## 2023-07-05 DIAGNOSIS — Z01419 Encounter for gynecological examination (general) (routine) without abnormal findings: Secondary | ICD-10-CM | POA: Diagnosis not present

## 2023-07-05 DIAGNOSIS — Z1231 Encounter for screening mammogram for malignant neoplasm of breast: Secondary | ICD-10-CM | POA: Diagnosis not present

## 2023-07-17 DIAGNOSIS — R21 Rash and other nonspecific skin eruption: Secondary | ICD-10-CM | POA: Diagnosis not present

## 2023-07-27 DIAGNOSIS — L0102 Bockhart's impetigo: Secondary | ICD-10-CM | POA: Diagnosis not present

## 2023-08-12 ENCOUNTER — Ambulatory Visit
Admission: EM | Admit: 2023-08-12 | Discharge: 2023-08-12 | Disposition: A | Discharge status: Home / Self Care | Payer: BLUE CROSS/BLUE SHIELD

## 2023-08-12 DIAGNOSIS — H109 Unspecified conjunctivitis: Secondary | ICD-10-CM | POA: Diagnosis not present

## 2023-08-12 MED ORDER — TOBRAMYCIN 0.3 % OP SOLN: 2.0000 [drp] | OPHTHALMIC | 0 refills | Status: DC

## 2023-08-12 NOTE — Discharge Instructions (Addendum)
Use eye drops as directed Can apply cold compress for comfort Recommend allergy medication like zyrtec or claritin

## 2023-08-12 NOTE — ED Provider Notes (Signed)
[facility]    CSN: [id number] Arrival date & time: 08/12/23  0801      History   Chief Complaint Chief Complaint  Patient presents with   Eye Problem    HPI Carla Frank is a 62 y.o. female.   Patient presents with right eye redness, drainage that started yesterday.  She also reports rhinorrhea.  Denies congestion, cough fever, chills.  She reports upon waking this morning the eyelashes were crusted.  She wears glasses but does not wear contacts.  She has tried nothing for the symptoms.  Denies sick contacts.    Past Medical History:  Diagnosis Date   Chronic UTI    since bladder sling surgery   Constipation    DDD (degenerative disc disease), lumbar    GERD (gastroesophageal reflux disease)    Hypertension    URI (upper respiratory infection) 05/30/2014    Patient Active Problem List   Diagnosis Date Noted   PVC's (premature ventricular contractions) 09/21/2017   Atypical chest pain 09/21/2017   Degenerative spondylolisthesis 07/20/2014    Past Surgical History:  Procedure Laterality Date   ABDOMINAL HYSTERECTOMY     CARPAL TUNNEL RELEASE Bilateral    INCONTINENCE SURGERY      OB History   No obstetric history on file.      Home Medications    Prior to Admission medications   Medication Sig Start Date End Date Taking? Authorizing Provider  estradiol (ESTRACE) 0.1 MG/GM vaginal cream Place 1 g vaginally every 7 (seven) days.   Yes [provider]  nitrofurantoin, macrocrystal-monohydrate, (MACROBID) 100 MG capsule Take 100 mg by mouth 2 (two) times daily.   Yes [provider]  Olmesartan-Amlodipine-HCTZ (TRIBENZOR) 40-10-25 MG TABS Take 1 tablet by mouth daily with breakfast.   Yes [provider]  potassium chloride SA (K-DUR,KLOR-CON) 20 MEQ tablet Take 1 tablet (20 mEq total) by mouth 2 (two) times daily. 02/17/18  Yes Donnetta Hutching, MD  tobramycin (TOBREX) 0.3 % ophthalmic solution Place 2 drops into the right  eye every 4 (four) hours. 08/12/23  Yes Ward, Tylene Fantasia, PA-C  traZODone (DESYREL) 50 MG tablet Take 1 tablet by mouth at bedtime as needed. 10/28/19  Yes [provider]  cephALEXin (KEFLEX) 500 MG capsule Take 500 mg by mouth daily.    [provider]  gabapentin (NEURONTIN) 100 MG capsule Take 100 mg by mouth 3 (three) times daily.    [provider]  ibuprofen (ADVIL,MOTRIN) 800 MG tablet Take 1 tablet (800 mg total) by mouth 3 (three) times daily. Patient taking differently: Take 800 mg by mouth every 8 (eight) hours as needed for moderate pain.  04/22/17   Dorena Bodo, NP  Multiple Vitamin (MULTIVITAMIN WITH MINERALS) TABS tablet Take 1 tablet by mouth daily.    [provider]    Family History Family History  Problem Relation Age of Onset   Dementia Mother     Social History Social History   Tobacco Use   Smoking status: Never   Smokeless tobacco: Never  Vaping Use   Vaping status: Never Used  Substance Use Topics   Alcohol use: Yes    Comment: socially   Drug use: No     Allergies   Amlodipine and Ofloxacin   Review of Systems Review of Systems  Constitutional:  Negative for chills and fever.  HENT:  Negative for ear pain and sore throat.   Eyes:  Positive for discharge and redness. Negative for pain  and visual disturbance.  Respiratory:  Negative for cough and shortness of breath.   Cardiovascular:  Negative for chest pain and palpitations.  Gastrointestinal:  Negative for abdominal pain and vomiting.  Genitourinary:  Negative for dysuria and hematuria.  Musculoskeletal:  Negative for arthralgias and back pain.  Skin:  Negative for color change and rash.  Neurological:  Negative for seizures and syncope.  All other systems reviewed and are negative.    Physical Exam Triage Vital Signs ED Triage Vitals  Encounter Vitals Group     BP 08/12/23 0812 (!) 143/88     Systolic BP Percentile --      Diastolic BP  Percentile --      Pulse Rate 08/12/23 0812 94     Resp 08/12/23 0812 18     Temp 08/12/23 0812 98.8 F (37.1 C)     Temp Source 08/12/23 0812 Oral     SpO2 08/12/23 0812 98 %     Weight 08/12/23 0809 185 lb (83.9 kg)     Height 08/12/23 0809 5\' 5"  (1.651 m)     Head Circumference --      Peak Flow --      Pain Score 08/12/23 0807 4     Pain Loc --      Pain Education --      Exclude from Growth Chart --    No data found.  Updated Vital Signs BP (!) 140/84 (BP Location: Left Arm)   Pulse 94   Temp 98.8 F (37.1 C) (Oral)   Resp 18   Ht 5\' 5"  (1.651 m)   Wt 185 lb (83.9 kg)   SpO2 98%   BMI 30.79 kg/m   Visual Acuity Right Eye Distance: 20/25 (Uncorrected) Left Eye Distance: 20/20 (Uncorrected) Bilateral Distance: 20/20 (Uncorrected)  Right Eye Near:   Left Eye Near:    Bilateral Near:     Physical Exam Vitals and nursing note reviewed.  Constitutional:      General: She is not in acute distress.    Appearance: She is well-developed.  HENT:     Head: Normocephalic and atraumatic.  Eyes:     General:        Right eye: Discharge present.     Conjunctiva/sclera:     Right eye: Right conjunctiva is injected.  Cardiovascular:     Rate and Rhythm: Normal rate and regular rhythm.     Heart sounds: No murmur heard. Pulmonary:     Effort: Pulmonary effort is normal. No respiratory distress.     Breath sounds: Normal breath sounds.  Abdominal:     Palpations: Abdomen is soft.     Tenderness: There is no abdominal tenderness.  Musculoskeletal:        General: No swelling.     Cervical back: Neck supple.  Skin:    General: Skin is warm and dry.     Capillary Refill: Capillary refill takes less than 2 seconds.  Neurological:     Mental Status: She is alert.  Psychiatric:        Mood and Affect: Mood normal.      UC Treatments / Results  Labs (all labs ordered are listed, but only abnormal results are displayed) Labs Reviewed - No data to  display  EKG   Radiology No results found.  Procedures Procedures (including critical care time)  Medications Ordered in UC Medications - No data to display  Initial Impression / Assessment and Plan / UC Course  I  have reviewed the triage vital signs and the nursing notes.  Pertinent labs & imaging results that were available during my care of the patient were reviewed by me and considered in my medical decision making (see chart for details).     Will treat for bacterial conjunctivitis.  Antibiotic eyedrops prescribed.  Discussed allergy medication for rhinorrhea.  No visual disturbances.  Patient does not wear contacts.  Work note given, patient works at surgery center, direct patient contact. Final Clinical Impressions(s) / UC Diagnoses   Final diagnoses:  Conjunctivitis, bacterial     Discharge Instructions      Use eye drops as directed Can apply cold compress for comfort Recommend allergy medication like zyrtec or claritin     ED Prescriptions     Medication Sig Dispense Auth. Provider   tobramycin (TOBREX) 0.3 % ophthalmic solution Place 2 drops into the right eye every 4 (four) hours. 5 mL Ward, Tylene Fantasia, PA-C      PDMP not reviewed this encounter.   Ward, Tylene Fantasia, PA-C 08/12/23 1004

## 2023-08-12 NOTE — ED Triage Notes (Signed)
"  My right eye feel's like something is in it". "Now my right nostril is having drainage". No fever. No injury known.

## 2023-10-12 DIAGNOSIS — I10 Essential (primary) hypertension: Secondary | ICD-10-CM | POA: Diagnosis not present

## 2023-10-12 DIAGNOSIS — J209 Acute bronchitis, unspecified: Secondary | ICD-10-CM | POA: Diagnosis not present

## 2023-10-26 DIAGNOSIS — M5432 Sciatica, left side: Secondary | ICD-10-CM | POA: Diagnosis not present

## 2023-12-10 DIAGNOSIS — R300 Dysuria: Secondary | ICD-10-CM | POA: Diagnosis not present

## 2024-01-07 DIAGNOSIS — Z6830 Body mass index (BMI) 30.0-30.9, adult: Secondary | ICD-10-CM | POA: Diagnosis not present

## 2024-01-07 DIAGNOSIS — E6609 Other obesity due to excess calories: Secondary | ICD-10-CM | POA: Diagnosis not present

## 2024-01-07 DIAGNOSIS — I10 Essential (primary) hypertension: Secondary | ICD-10-CM | POA: Diagnosis not present

## 2024-01-16 DIAGNOSIS — R300 Dysuria: Secondary | ICD-10-CM | POA: Diagnosis not present

## 2024-01-16 DIAGNOSIS — R350 Frequency of micturition: Secondary | ICD-10-CM | POA: Diagnosis not present

## 2024-01-16 DIAGNOSIS — N393 Stress incontinence (female) (male): Secondary | ICD-10-CM | POA: Diagnosis not present

## 2024-01-16 DIAGNOSIS — N3642 Intrinsic sphincter deficiency (ISD): Secondary | ICD-10-CM | POA: Diagnosis not present

## 2024-01-16 DIAGNOSIS — N952 Postmenopausal atrophic vaginitis: Secondary | ICD-10-CM | POA: Diagnosis not present

## 2024-01-16 DIAGNOSIS — Z87440 Personal history of urinary (tract) infections: Secondary | ICD-10-CM | POA: Diagnosis not present

## 2024-02-04 ENCOUNTER — Emergency Department (HOSPITAL_BASED_OUTPATIENT_CLINIC_OR_DEPARTMENT_OTHER): Payer: Medicare HMO

## 2024-02-04 ENCOUNTER — Emergency Department (HOSPITAL_BASED_OUTPATIENT_CLINIC_OR_DEPARTMENT_OTHER)
Admission: EM | Admit: 2024-02-04 | Discharge: 2024-02-04 | Disposition: A | Discharge status: Home / Self Care | Payer: Medicare HMO | Attending: Emergency Medicine | Admitting: Emergency Medicine

## 2024-02-04 ENCOUNTER — Other Ambulatory Visit: Payer: Self-pay

## 2024-02-04 ENCOUNTER — Encounter (HOSPITAL_BASED_OUTPATIENT_CLINIC_OR_DEPARTMENT_OTHER): Payer: Self-pay | Admitting: Emergency Medicine

## 2024-02-04 DIAGNOSIS — R109 Unspecified abdominal pain: Secondary | ICD-10-CM | POA: Diagnosis not present

## 2024-02-04 DIAGNOSIS — N838 Other noninflammatory disorders of ovary, fallopian tube and broad ligament: Secondary | ICD-10-CM | POA: Diagnosis not present

## 2024-02-04 DIAGNOSIS — R1031 Right lower quadrant pain: Secondary | ICD-10-CM | POA: Diagnosis not present

## 2024-02-04 DIAGNOSIS — Z79899 Other long term (current) drug therapy: Secondary | ICD-10-CM | POA: Insufficient documentation

## 2024-02-04 DIAGNOSIS — I10 Essential (primary) hypertension: Secondary | ICD-10-CM | POA: Diagnosis not present

## 2024-02-04 DIAGNOSIS — N3001 Acute cystitis with hematuria: Secondary | ICD-10-CM | POA: Insufficient documentation

## 2024-02-04 LAB — COMPREHENSIVE METABOLIC PANEL
ALT: 17 U/L (ref 0–44)
AST: 17 U/L (ref 15–41)
Albumin: 4.3 g/dL (ref 3.5–5.0)
Alkaline Phosphatase: 63 U/L (ref 38–126)
Anion gap: 9 (ref 5–15)
BUN: 10 mg/dL (ref 8–23)
CO2: 29 mmol/L (ref 22–32)
Calcium: 9.5 mg/dL (ref 8.9–10.3)
Chloride: 103 mmol/L (ref 98–111)
Creatinine, Ser: 0.7 mg/dL (ref 0.44–1.00)
GFR, Estimated: >60 mL/min (ref >60)
Glucose, Bld: 108 mg/dL — ABNORMAL HIGH (ref 70–99)
Potassium: 3.4 mmol/L — ABNORMAL LOW (ref 3.5–5.1)
Sodium: 141 mmol/L (ref 135–145)
Total Bilirubin: 0.4 mg/dL (ref 0.0–1.2)
Total Protein: 7.4 g/dL (ref 6.5–8.1)

## 2024-02-04 LAB — URINALYSIS, ROUTINE W REFLEX MICROSCOPIC
Bilirubin Urine: NEGATIVE
Glucose, UA: NEGATIVE mg/dL
Ketones, ur: NEGATIVE mg/dL
Nitrite: POSITIVE — AB
Specific Gravity, Urine: 1.019 (ref 1.005–1.030)
Trans Epithel, UA: 1
WBC, UA: >50 WBC/hpf (ref 0–5)
pH: 5.5 (ref 5.0–8.0)

## 2024-02-04 LAB — CBC
HCT: 40.5 % (ref 36.0–46.0)
Hemoglobin: 13.1 g/dL (ref 12.0–15.0)
MCH: 27 pg (ref 26.0–34.0)
MCHC: 32.3 g/dL (ref 30.0–36.0)
MCV: 83.5 fL (ref 80.0–100.0)
Platelets: 237 10*3/uL (ref 150–400)
RBC: 4.85 MIL/uL (ref 3.87–5.11)
RDW: 14.4 % (ref 11.5–15.5)
WBC: 4.3 10*3/uL (ref 4.0–10.5)
nRBC: 0 % (ref 0.0–0.2)

## 2024-02-04 LAB — LIPASE, BLOOD: Lipase: 23 U/L (ref 11–51)

## 2024-02-04 MED ORDER — KETOROLAC TROMETHAMINE 15 MG/ML IJ SOLN
15.0000 mg | Freq: Once | INTRAMUSCULAR | Status: AC
  Administered 2024-02-04: 15 mg via INTRAMUSCULAR
  Filled 2024-02-04: qty 1

## 2024-02-04 MED ORDER — CEPHALEXIN 250 MG PO CAPS
500.0000 mg | ORAL_CAPSULE | Freq: Once | ORAL | Status: AC
  Administered 2024-02-04: 500 mg via ORAL
  Filled 2024-02-04: qty 2

## 2024-02-04 MED ORDER — CEPHALEXIN 500 MG PO CAPS: 500.0000 mg | ORAL_CAPSULE | Freq: Four times a day (QID) | ORAL | 0 refills | Status: AC

## 2024-02-04 NOTE — ED Provider Notes (Signed)
[place] EMERGENCY DEPARTMENT AT [facility] Provider Note   CSN: [id number] Arrival date & time: 02/04/24  1140     History  Chief Complaint  Patient presents with   Abdominal Pain    Carla Frank is a 63 y.o. female with a history of chronic UTI, hypertension, and degenerative disc disease who presents the ED today for abdominal pain.  Patient reports right lower quadrant pain for the past several months.  Patient reports that she was seen by urology several weeks ago for similar complaint.  She was started on the medication and discontinued due to the urine culture not growing any bacteria.  Her urologist ordered an ultrasound for patient to get to rule out kidney stone.  Denies history of kidney stone in the past.  She states that her symptoms have been worsening over the past several days.  Denies associated dysuria, hematuria, fevers, vomiting, or diarrhea.  No additional complaints or concerns at this time.    Home Medications Prior to Admission medications   Medication Sig Start Date End Date Taking? Authorizing Provider  cephALEXin (KEFLEX) 500 MG capsule Take 1 capsule (500 mg total) by mouth 4 (four) times daily for 7 days. 02/04/24 02/11/24 Yes Maxwell Marion, PA-C  estradiol (ESTRACE) 0.1 MG/GM vaginal cream Place 1 g vaginally every 7 (seven) days.    [provider]  gabapentin (NEURONTIN) 100 MG capsule Take 100 mg by mouth 3 (three) times daily.    [provider]  ibuprofen (ADVIL,MOTRIN) 800 MG tablet Take 1 tablet (800 mg total) by mouth 3 (three) times daily. Patient taking differently: Take 800 mg by mouth every 8 (eight) hours as needed for moderate pain.  04/22/17   Dorena Bodo, NP  Multiple Vitamin (MULTIVITAMIN WITH MINERALS) TABS tablet Take 1 tablet by mouth daily.    [provider]  nitrofurantoin, macrocrystal-monohydrate, (MACROBID) 100 MG capsule Take 100 mg by mouth 2 (two) times daily.    [provider]   Olmesartan-Amlodipine-HCTZ (TRIBENZOR) 40-10-25 MG TABS Take 1 tablet by mouth daily with breakfast.    [provider]  potassium chloride SA (K-DUR,KLOR-CON) 20 MEQ tablet Take 1 tablet (20 mEq total) by mouth 2 (two) times daily. 02/17/18   Donnetta Hutching, MD  tobramycin (TOBREX) 0.3 % ophthalmic solution Place 2 drops into the right eye every 4 (four) hours. 08/12/23   Ward, Tylene Fantasia, PA-C  traZODone (DESYREL) 50 MG tablet Take 1 tablet by mouth at bedtime as needed. 10/28/19   [provider]      Allergies    Amlodipine and Ofloxacin    Review of Systems   Review of Systems  Gastrointestinal:  Positive for abdominal pain.  All other systems reviewed and are negative.   Physical Exam Updated Vital Signs BP 120/88   Pulse 87   Temp 98.7 F (37.1 C) (Oral)   Resp 18   SpO2 99%  Physical Exam Vitals and nursing note reviewed.  Constitutional:      General: She is not in acute distress.    Appearance: Normal appearance.  HENT:     Head: Normocephalic and atraumatic.     Mouth/Throat:     Mouth: Mucous membranes are moist.  Eyes:     Conjunctiva/sclera: Conjunctivae normal.     Pupils: Pupils are equal, round, and reactive to light.  Cardiovascular:     Rate and Rhythm: Normal rate and regular rhythm.     Pulses: Normal pulses.     Heart  sounds: Normal heart sounds.  Pulmonary:     Effort: Pulmonary effort is normal.     Breath sounds: Normal breath sounds.  Abdominal:     Palpations: Abdomen is soft.     Tenderness: There is abdominal tenderness.     Comments: RLQ tenderness to palpation that extends to the right hip  Skin:    General: Skin is warm and dry.     Findings: No rash.  Neurological:     General: No focal deficit present.     Mental Status: She is alert.  Psychiatric:        Mood and Affect: Mood normal.        Behavior: Behavior normal.    ED Results / Procedures / Treatments   Labs (all labs ordered are listed, but only abnormal  results are displayed) Labs Reviewed  COMPREHENSIVE METABOLIC PANEL - Abnormal; Notable for the following components:      Result Value   Potassium 3.4 (*)    Glucose, Bld 108 (*)    All other components within normal limits  URINALYSIS, ROUTINE W REFLEX MICROSCOPIC - Abnormal; Notable for the following components:   APPearance HAZY (*)    Hgb urine dipstick MODERATE (*)    Protein, ur TRACE (*)    Nitrite POSITIVE (*)    Leukocytes,Ua MODERATE (*)    Bacteria, UA MANY (*)    All other components within normal limits  URINE CULTURE  LIPASE, BLOOD  CBC    EKG None  Radiology CT Renal Stone Study Result Date: 02/04/2024 CLINICAL DATA:  Abdominal/flank pain, stone suspected EXAM: CT ABDOMEN AND PELVIS WITHOUT CONTRAST TECHNIQUE: Multidetector CT imaging of the abdomen and pelvis was performed following the standard protocol without IV contrast. RADIATION DOSE REDUCTION: This exam was performed according to the departmental dose-optimization program which includes automated exposure control, adjustment of the mA and/or kV according to patient size and/or use of iterative reconstruction technique. COMPARISON:  04/30/2017 FINDINGS: Lower chest: No acute abnormality. Hepatobiliary: Unremarkable liver. Normal gallbladder. No biliary dilation. Pancreas: Unremarkable. Spleen: Unremarkable. Adrenals/Urinary Tract: Normal adrenal glands. No urinary calculi or hydronephrosis. Bladder is unremarkable. High density nodules at the bladder base are similar to prior may be related to surgery. Stomach/Bowel: Normal caliber large and small bowel. No bowel wall thickening. The appendix is normal.Stomach is within normal limits. Vascular/Lymphatic: Aortic atherosclerosis. No enlarged abdominal or pelvic lymph nodes. Reproductive: Hysterectomy. 3.4 cm cystic lesion in the right ovary. This is new or has slowly enlarged since 05/10/2017. Recommend follow-up pelvic ultrasound in 6-12 months. Other: No free  intraperitoneal fluid or air. Musculoskeletal: No acute fracture. Bilateral THA. Posterior fusion L3-L4. IMPRESSION: 1. No acute abnormality in the abdomen or pelvis. 2. 3.4 cm cystic lesion in the right ovary. Recommend follow-up pelvic ultrasound in 6-12 months. Electronically Signed   By: Minerva Fester M.D.   On: 02/04/2024 19:20    Procedures Procedures: not indicated.   Medications Ordered in ED Medications  ketorolac (TORADOL) 15 MG/ML injection 15 mg (15 mg Intramuscular Given 02/04/24 1827)  cephALEXin (KEFLEX) capsule 500 mg (500 mg Oral Given 02/04/24 1948)    ED Course/ Medical Decision Making/ A&P                                 Medical Decision Making Amount and/or Complexity of Data Reviewed Labs: ordered. Radiology: ordered.  Risk Prescription drug management.   This patient presents  to the ED for concern of right lower quadrant pain, this involves an extensive number of treatment options, and is a complaint that carries with it a high risk of complications and morbidity.   Differential diagnosis includes: UTI, pyelonephritis, kidney stone, colitis, appendicitis, constipation, IBS, IBD, etc.   Comorbidities  See HPI above   Additional History  Additional history obtained from previous urology notes   Lab Tests  I ordered and personally interpreted labs.  The pertinent results include:   Urine positive for leukocytes, nitrites, and moderate hemoglobin, specimen is contaminated.  Will treat for UTI based off right lower quadrant pain.  Urine culture pending. CMP, lipase, and CBC are within normal limits - no acute electrolyte derangement, AKI, infection, or anemia   Imaging Studies  I ordered imaging studies including CT renal stone study  I independently visualized and interpreted imaging which showed:  No acute abnormality in the abdomen or pelvis 3.4 cm cystic lesion in the right ovary.  Pelvic ultrasound recommended the next 6 to 12 months.   Patient aware of incidental finding. I agree with the radiologist interpretation   Problem List / ED Course / Critical Interventions / Medication Management  Right lower quadrant pain intermittently for the past several months.  Pain has been worsening over the past several days. I ordered medications including: Toradol for pain Keflex for UTI I have reviewed the patients home medicines and have made adjustments as needed.  Prescription for cephalexin to the pharmacy. Discussed findings with patient.  All questions were answered.   Social Determinants of Health  Access to healthcare   Test / Admission - Considered  She is hemodynamically stable and safe discharge home. Return precautions provided.       Final Clinical Impression(s) / ED Diagnoses Final diagnoses:  Acute cystitis with hematuria    Rx / DC Orders ED Discharge Orders          Ordered    cephALEXin (KEFLEX) 500 MG capsule  4 times daily        02/04/24 1931              Maxwell Marion, PA-C 02/04/24 1957    Vanetta Mulders, MD 02/07/24 1330

## 2024-02-04 NOTE — ED Notes (Signed)
 Patient unable to provide urine sample at this time. Specimen cup given to patient and reviewed instructions for clean catch collection.

## 2024-02-04 NOTE — ED Triage Notes (Signed)
 Right side lower abdo into groin pain Started before christmas but getting worse,  Stabbing pain that comes and goes

## 2024-02-04 NOTE — Discharge Instructions (Signed)
 As discussed, your urine came back positive for UTI.  Take Keflex  4 times a day for the next 7 days.  You received the first dose in the ED tonight.  We have sent your urine for culture.  You will receive a call from the hospital if the antibiotics you are on are not susceptible to the bacteria and you will be switched to different antibiotic at the time.  If you not hear from the hospital, continue the Keflex .  Your CT showed an incidental finding of a 3.4 cm lesion on the right ovary.  Radiologist recommended repeat pelvic ultrasound in the next 6-12 months for reevaluation.  You can follow-up with your primary care doctor for this.  Get help right away if: You have very bad pain in your back or lower belly. You faint.

## 2024-02-06 LAB — URINE CULTURE: Culture: >=100000 — AB

## 2024-02-07 ENCOUNTER — Telehealth (HOSPITAL_BASED_OUTPATIENT_CLINIC_OR_DEPARTMENT_OTHER): Payer: Self-pay

## 2024-02-07 NOTE — Telephone Encounter (Signed)
Post ED Visit - Positive Culture Follow-up  Culture report reviewed by antimicrobial stewardship pharmacist: [facility] []  Enzo Bi, Pharm.D. []  Celedonio Miyamoto, Pharm.D., BCPS AQ-ID []  Garvin Fila, Pharm.D., BCPS []  Georgina Pillion, Pharm.D., BCPS []  [place], [place].D., BCPS, AAHIVP []  Estella Husk, Pharm.D., BCPS, AAHIVP []  Lysle Pearl, PharmD, BCPS []  Phillips Climes, PharmD, BCPS []  Agapito Games, PharmD, BCPS []  Verlan Friends, PharmD []  Mervyn Gay, PharmD, BCPS []  Vinnie Level, PharmD X   Cedric Fishman, PharmD  [facility] []  Len Childs, PharmD []  Greer Pickerel, PharmD []  Adalberto Cole, PharmD []  Perlie Gold, Rph []  Lonell Face) Jean Rosenthal, PharmD []  Earl Many, PharmD []  Junita Push, PharmD []  Dorna Leitz, PharmD []  Terrilee Files, PharmD []  Lynann Beaver, PharmD []  Keturah Barre, PharmD []  Loralee Pacas, PharmD []  Bernadene Person, PharmD   Positive urine culture Treated with Cephalexin, organism sensitive to the same and no further patient follow-up is required at this time.  Arvid Right 02/07/2024, 9:18 AM

## 2024-02-26 DIAGNOSIS — Z87440 Personal history of urinary (tract) infections: Secondary | ICD-10-CM | POA: Diagnosis not present

## 2024-02-26 DIAGNOSIS — N3642 Intrinsic sphincter deficiency (ISD): Secondary | ICD-10-CM | POA: Diagnosis not present

## 2024-02-26 DIAGNOSIS — N952 Postmenopausal atrophic vaginitis: Secondary | ICD-10-CM | POA: Diagnosis not present

## 2024-02-26 DIAGNOSIS — N393 Stress incontinence (female) (male): Secondary | ICD-10-CM | POA: Diagnosis not present

## 2024-03-04 DIAGNOSIS — Z90710 Acquired absence of both cervix and uterus: Secondary | ICD-10-CM | POA: Diagnosis not present

## 2024-03-04 DIAGNOSIS — N83201 Unspecified ovarian cyst, right side: Secondary | ICD-10-CM | POA: Diagnosis not present

## 2024-03-25 DIAGNOSIS — H16143 Punctate keratitis, bilateral: Secondary | ICD-10-CM | POA: Diagnosis not present

## 2024-03-25 DIAGNOSIS — H04123 Dry eye syndrome of bilateral lacrimal glands: Secondary | ICD-10-CM | POA: Diagnosis not present

## 2024-03-25 DIAGNOSIS — H25813 Combined forms of age-related cataract, bilateral: Secondary | ICD-10-CM | POA: Diagnosis not present

## 2024-03-25 DIAGNOSIS — H43813 Vitreous degeneration, bilateral: Secondary | ICD-10-CM | POA: Diagnosis not present

## 2024-03-27 NOTE — Progress Notes (Signed)
 Patient was referred for cystoscopy for evaluation of recurrent UTIs.  Upon chart review, patient's last urine culture was positive which was untreated.  I discussed with the patient and the referring provider that instrumentation in the setting of untreated positive culture  even  if the patient is asymptomatic carries  the risk of severe infection.  Therefore we will reschedule her cystoscopy with preprocedure antibiotics treatment.

## 2024-04-01 DIAGNOSIS — Z90710 Acquired absence of both cervix and uterus: Secondary | ICD-10-CM | POA: Diagnosis not present

## 2024-04-01 DIAGNOSIS — R109 Unspecified abdominal pain: Secondary | ICD-10-CM | POA: Diagnosis not present

## 2024-04-01 DIAGNOSIS — N83201 Unspecified ovarian cyst, right side: Secondary | ICD-10-CM | POA: Diagnosis not present

## 2024-04-17 DIAGNOSIS — Z87440 Personal history of urinary (tract) infections: Secondary | ICD-10-CM | POA: Diagnosis not present

## 2024-04-17 DIAGNOSIS — Z09 Encounter for follow-up examination after completed treatment for conditions other than malignant neoplasm: Secondary | ICD-10-CM | POA: Diagnosis not present

## 2024-04-17 NOTE — Progress Notes (Signed)
  Diagnostic cystoscopy  Performed by: Lauraine Fleming, MD Authorized by: Lauraine Fleming, MD   Procedure Details    Cystoscope type: flexible   Cystoscopy route: transurethral     Cystoscopy location: native bladder     Irrigation used: saline    Urethra    Urethra: normal     Urethra comment: Good coaptation of urethra , on retroflexion ,   circumferential mound around the urethra visualized   Bladder    Bladder: normal    Post-Procedure Details    Catheter placed: no     Appearance of urine after procedure: clear   Outcome: patient tolerated procedure well with no complications     Disposition: discharged home in satisfactory condition

## 2024-04-30 DIAGNOSIS — I10 Essential (primary) hypertension: Secondary | ICD-10-CM | POA: Diagnosis not present

## 2024-04-30 DIAGNOSIS — E7800 Pure hypercholesterolemia, unspecified: Secondary | ICD-10-CM | POA: Diagnosis not present

## 2024-04-30 DIAGNOSIS — Z1159 Encounter for screening for other viral diseases: Secondary | ICD-10-CM | POA: Diagnosis not present

## 2024-05-15 ENCOUNTER — Encounter (INDEPENDENT_AMBULATORY_CARE_PROVIDER_SITE_OTHER): Payer: Self-pay | Admitting: Otolaryngology

## 2024-05-15 ENCOUNTER — Ambulatory Visit (INDEPENDENT_AMBULATORY_CARE_PROVIDER_SITE_OTHER): Admitting: Otolaryngology

## 2024-05-15 VITALS — BP 131/79 | HR 87

## 2024-05-15 DIAGNOSIS — H6123 Impacted cerumen, bilateral: Secondary | ICD-10-CM | POA: Diagnosis not present

## 2024-05-15 NOTE — Progress Notes (Signed)
 Patient ID: Carla Frank, female   DOB: 30-Oct-1961, 62 y.o.   MRN: [id number]  Procedure: Bilateral cerumen disimpaction.   Indication: Cerumen impaction, resulting in ear discomfort and conductive hearing loss.   Description: The patient is placed supine on the operating table. Under the operating microscope, the right ear canal is examined and is noted to be impacted with cerumen. The cerumen is carefully removed with a combination of suction catheters, cerumen curette, and alligator forceps. After the cerumen removal, the ear canal and tympanic membrane are noted to be normal. No middle ear effusion is noted. The same procedure is then repeated on the left side without exception. The patient tolerated the procedure well.  Follow-up care:  The patient is instructed not to use Q-tips to clean the ear canals. The patient will follow up in 12 months.

## 2024-05-20 DIAGNOSIS — R21 Rash and other nonspecific skin eruption: Secondary | ICD-10-CM | POA: Diagnosis not present

## 2024-05-30 DIAGNOSIS — R399 Unspecified symptoms and signs involving the genitourinary system: Secondary | ICD-10-CM | POA: Diagnosis not present

## 2024-05-30 DIAGNOSIS — Z87440 Personal history of urinary (tract) infections: Secondary | ICD-10-CM | POA: Diagnosis not present

## 2024-06-02 NOTE — Telephone Encounter (Signed)
 VM received from pt stating she had a urine done recently, saw results on portal and wonders if abx will be called in. Asked for call back.    Result note received from CPA this am, routed back to her with question, will call pt after CPA's response received.  Eleanor, RN

## 2024-06-03 NOTE — Telephone Encounter (Addendum)
 Returned call to patient and relayed the abx order sent in by Dr. Vicci. Patient stated understanding.         ----- Message from Lauraine Vicci, MD sent at 06/02/2024  1:32 PM EDT ----- Regarding: RE: It looks like it was ordered from [facility] office for UTI symptoms. Who ordered it ? Why is it coming under my name ? ----- Message ----- From: Avelina DELENA Sharps, RN Sent: 06/02/2024  12:53 PM EDT To: Lauraine Vicci, MD Subject: It looks like it was ordered from [place]#  It looks like it was ordered from [facility] office for UTI symptoms.Thanks, Avelina RN ----- Message ----- From: Lauraine Vicci, MD Sent: 06/02/2024   9:23 AM EDT To: [facility]  Was she syptomatic ? Is that why a urine culture is ordered ? ----- Message ----- From: Lab, Background User Sent: 05/31/2024   4:30 PM EDT To: Lauraine Vicci, MD

## 2024-06-18 DIAGNOSIS — N390 Urinary tract infection, site not specified: Secondary | ICD-10-CM | POA: Diagnosis not present

## 2024-06-30 DIAGNOSIS — N390 Urinary tract infection, site not specified: Secondary | ICD-10-CM | POA: Diagnosis not present

## 2024-07-10 DIAGNOSIS — Z90710 Acquired absence of both cervix and uterus: Secondary | ICD-10-CM | POA: Diagnosis not present

## 2024-07-10 DIAGNOSIS — N83201 Unspecified ovarian cyst, right side: Secondary | ICD-10-CM | POA: Diagnosis not present

## 2024-07-10 DIAGNOSIS — Z01419 Encounter for gynecological examination (general) (routine) without abnormal findings: Secondary | ICD-10-CM | POA: Diagnosis not present

## 2024-07-10 DIAGNOSIS — N952 Postmenopausal atrophic vaginitis: Secondary | ICD-10-CM | POA: Diagnosis not present

## 2024-07-10 DIAGNOSIS — I10 Essential (primary) hypertension: Secondary | ICD-10-CM | POA: Diagnosis not present

## 2024-07-10 DIAGNOSIS — Z1231 Encounter for screening mammogram for malignant neoplasm of breast: Secondary | ICD-10-CM | POA: Diagnosis not present

## 2024-07-11 ENCOUNTER — Encounter: Payer: Self-pay | Admitting: Advanced Practice Midwife

## 2024-07-11 DIAGNOSIS — N390 Urinary tract infection, site not specified: Secondary | ICD-10-CM | POA: Diagnosis not present

## 2024-07-21 DIAGNOSIS — R300 Dysuria: Secondary | ICD-10-CM | POA: Diagnosis not present

## 2024-07-21 DIAGNOSIS — N952 Postmenopausal atrophic vaginitis: Secondary | ICD-10-CM | POA: Diagnosis not present

## 2024-07-21 DIAGNOSIS — N393 Stress incontinence (female) (male): Secondary | ICD-10-CM | POA: Diagnosis not present

## 2024-07-21 DIAGNOSIS — N3000 Acute cystitis without hematuria: Secondary | ICD-10-CM | POA: Diagnosis not present

## 2024-07-21 DIAGNOSIS — N390 Urinary tract infection, site not specified: Secondary | ICD-10-CM | POA: Diagnosis not present

## 2024-07-24 DIAGNOSIS — I10 Essential (primary) hypertension: Secondary | ICD-10-CM | POA: Diagnosis not present

## 2024-07-24 DIAGNOSIS — E7800 Pure hypercholesterolemia, unspecified: Secondary | ICD-10-CM | POA: Diagnosis not present

## 2024-07-24 DIAGNOSIS — E6609 Other obesity due to excess calories: Secondary | ICD-10-CM | POA: Diagnosis not present

## 2024-08-05 DIAGNOSIS — N390 Urinary tract infection, site not specified: Secondary | ICD-10-CM | POA: Diagnosis not present

## 2024-08-21 DIAGNOSIS — N390 Urinary tract infection, site not specified: Secondary | ICD-10-CM | POA: Diagnosis not present

## 2024-08-21 DIAGNOSIS — B3731 Acute candidiasis of vulva and vagina: Secondary | ICD-10-CM | POA: Diagnosis not present

## 2024-08-22 DIAGNOSIS — S80861A Insect bite (nonvenomous), right lower leg, initial encounter: Secondary | ICD-10-CM | POA: Diagnosis not present

## 2024-08-24 DIAGNOSIS — E7800 Pure hypercholesterolemia, unspecified: Secondary | ICD-10-CM | POA: Diagnosis not present

## 2024-08-24 DIAGNOSIS — E6609 Other obesity due to excess calories: Secondary | ICD-10-CM | POA: Diagnosis not present

## 2024-08-24 DIAGNOSIS — I10 Essential (primary) hypertension: Secondary | ICD-10-CM | POA: Diagnosis not present

## 2024-09-23 DIAGNOSIS — E6609 Other obesity due to excess calories: Secondary | ICD-10-CM | POA: Diagnosis not present

## 2024-09-23 DIAGNOSIS — E7800 Pure hypercholesterolemia, unspecified: Secondary | ICD-10-CM | POA: Diagnosis not present

## 2024-09-23 DIAGNOSIS — I10 Essential (primary) hypertension: Secondary | ICD-10-CM | POA: Diagnosis not present

## 2024-09-26 DIAGNOSIS — N390 Urinary tract infection, site not specified: Secondary | ICD-10-CM | POA: Diagnosis not present

## 2024-09-26 DIAGNOSIS — R399 Unspecified symptoms and signs involving the genitourinary system: Secondary | ICD-10-CM | POA: Diagnosis not present

## 2024-09-30 DIAGNOSIS — Z1211 Encounter for screening for malignant neoplasm of colon: Secondary | ICD-10-CM | POA: Diagnosis not present

## 2024-10-07 LAB — COLOGUARD: COLOGUARD: NEGATIVE

## 2024-10-21 DIAGNOSIS — R82998 Other abnormal findings in urine: Secondary | ICD-10-CM | POA: Diagnosis not present

## 2024-10-21 DIAGNOSIS — N390 Urinary tract infection, site not specified: Secondary | ICD-10-CM | POA: Diagnosis not present

## 2024-10-22 DIAGNOSIS — R3989 Other symptoms and signs involving the genitourinary system: Secondary | ICD-10-CM | POA: Diagnosis not present

## 2024-10-22 DIAGNOSIS — N952 Postmenopausal atrophic vaginitis: Secondary | ICD-10-CM | POA: Diagnosis not present

## 2024-10-23 DIAGNOSIS — R399 Unspecified symptoms and signs involving the genitourinary system: Secondary | ICD-10-CM | POA: Diagnosis not present

## 2024-10-24 DIAGNOSIS — N390 Urinary tract infection, site not specified: Secondary | ICD-10-CM | POA: Diagnosis not present

## 2024-10-24 DIAGNOSIS — I10 Essential (primary) hypertension: Secondary | ICD-10-CM | POA: Diagnosis not present

## 2024-10-24 DIAGNOSIS — E7800 Pure hypercholesterolemia, unspecified: Secondary | ICD-10-CM | POA: Diagnosis not present

## 2024-10-24 DIAGNOSIS — E6609 Other obesity due to excess calories: Secondary | ICD-10-CM | POA: Diagnosis not present

## 2024-10-31 DIAGNOSIS — N390 Urinary tract infection, site not specified: Secondary | ICD-10-CM | POA: Diagnosis not present

## 2024-11-03 DIAGNOSIS — Z96641 Presence of right artificial hip joint: Secondary | ICD-10-CM | POA: Diagnosis not present

## 2024-11-03 DIAGNOSIS — N3000 Acute cystitis without hematuria: Secondary | ICD-10-CM | POA: Diagnosis not present

## 2024-11-05 DIAGNOSIS — Z87440 Personal history of urinary (tract) infections: Secondary | ICD-10-CM | POA: Diagnosis not present

## 2024-11-05 DIAGNOSIS — N323 Diverticulum of bladder: Secondary | ICD-10-CM | POA: Diagnosis not present

## 2024-11-05 DIAGNOSIS — N390 Urinary tract infection, site not specified: Secondary | ICD-10-CM | POA: Diagnosis not present

## 2024-11-05 DIAGNOSIS — N2889 Other specified disorders of kidney and ureter: Secondary | ICD-10-CM | POA: Diagnosis not present

## 2024-11-14 DIAGNOSIS — N390 Urinary tract infection, site not specified: Secondary | ICD-10-CM | POA: Diagnosis not present

## 2024-11-17 ENCOUNTER — Telehealth (INDEPENDENT_AMBULATORY_CARE_PROVIDER_SITE_OTHER): Payer: Self-pay

## 2024-11-17 NOTE — Telephone Encounter (Signed)
 Patient called asking to get scheduled with Dr. Karis.

## 2024-11-19 DIAGNOSIS — N390 Urinary tract infection, site not specified: Secondary | ICD-10-CM | POA: Diagnosis not present

## 2024-11-23 DIAGNOSIS — E7800 Pure hypercholesterolemia, unspecified: Secondary | ICD-10-CM | POA: Diagnosis not present

## 2024-11-23 DIAGNOSIS — E6609 Other obesity due to excess calories: Secondary | ICD-10-CM | POA: Diagnosis not present

## 2024-11-23 DIAGNOSIS — I10 Essential (primary) hypertension: Secondary | ICD-10-CM | POA: Diagnosis not present

## 2024-11-25 DIAGNOSIS — R8271 Bacteriuria: Secondary | ICD-10-CM | POA: Diagnosis not present

## 2024-11-25 DIAGNOSIS — N323 Diverticulum of bladder: Secondary | ICD-10-CM | POA: Diagnosis not present

## 2024-11-28 DIAGNOSIS — N390 Urinary tract infection, site not specified: Secondary | ICD-10-CM | POA: Diagnosis not present

## 2024-12-05 DIAGNOSIS — N390 Urinary tract infection, site not specified: Secondary | ICD-10-CM | POA: Diagnosis not present

## 2024-12-10 DIAGNOSIS — N390 Urinary tract infection, site not specified: Secondary | ICD-10-CM | POA: Diagnosis not present

## 2025-01-12 ENCOUNTER — Ambulatory Visit (INDEPENDENT_AMBULATORY_CARE_PROVIDER_SITE_OTHER): Admitting: Otolaryngology

## 2025-01-12 ENCOUNTER — Encounter (INDEPENDENT_AMBULATORY_CARE_PROVIDER_SITE_OTHER): Payer: Self-pay | Admitting: Otolaryngology

## 2025-01-12 VITALS — BP 149/88 | HR 65

## 2025-01-12 DIAGNOSIS — H6123 Impacted cerumen, bilateral: Secondary | ICD-10-CM

## 2025-01-12 NOTE — Progress Notes (Signed)
 Patient ID: Carla Frank, female   DOB: [birth year], 64 y.o.   MRN: [id number]  Procedure: Bilateral cerumen disimpaction.   Indication: Cerumen impaction, resulting in ear discomfort and conductive hearing loss.   Description: The patient is placed supine on the operating table. Under the operating microscope, the right ear canal is examined and is noted to be impacted with cerumen. The cerumen is carefully removed with a combination of suction catheters, cerumen curette, and alligator forceps. After the cerumen removal, the ear canal and tympanic membrane are noted to be normal. No middle ear effusion is noted. The same procedure is then repeated on the left side without exception. The patient tolerated the procedure well.  Follow-up care:  The patient is instructed not to use Q-tips to clean the ear canals. The patient will follow up as needed.

## 2025-01-20 ENCOUNTER — Encounter: Payer: Self-pay | Admitting: Emergency Medicine

## 2025-01-20 ENCOUNTER — Ambulatory Visit
Admission: EM | Admit: 2025-01-20 | Discharge: 2025-01-20 | Disposition: A | Discharge status: Home / Self Care | Attending: Physician Assistant | Admitting: Physician Assistant

## 2025-01-20 ENCOUNTER — Other Ambulatory Visit: Payer: Self-pay

## 2025-01-20 DIAGNOSIS — H109 Unspecified conjunctivitis: Secondary | ICD-10-CM | POA: Diagnosis not present

## 2025-01-20 MED ORDER — TOBRAMYCIN 0.3 % OP SOLN: 1.0000 [drp] | OPHTHALMIC | 0 refills | Status: AC

## 2025-01-20 NOTE — Discharge Instructions (Addendum)
 Return if any problems.

## 2025-01-20 NOTE — ED Provider Notes (Signed)
 " [facility]    CSN: [id number] Arrival date & time: 01/20/25  1551      History   Chief Complaint Chief Complaint  Patient presents with   Conjunctivitis    HPI Carla Frank is a 64 y.o. female.   Patient complains of redness to her left eyelid.  Patient is here with husband who has bilateral conjunctivitis.  Patient reports she is having some itching.  Patient reports that she was sick last week with a fever and cough.  The history is provided by the patient. No language interpreter was used.  Conjunctivitis    Past Medical History:  Diagnosis Date   Chronic UTI    since bladder sling surgery   Constipation    DDD (degenerative disc disease), lumbar    GERD (gastroesophageal reflux disease)    Hypertension    URI (upper respiratory infection) 05/30/2014    Patient Active Problem List   Diagnosis Date Noted   Impacted cerumen of both ears 05/15/2024   PVC's (premature ventricular contractions) 09/21/2017   Atypical chest pain 09/21/2017   Degenerative spondylolisthesis 07/20/2014    Past Surgical History:  Procedure Laterality Date   ABDOMINAL HYSTERECTOMY     CARPAL TUNNEL RELEASE Bilateral    INCONTINENCE SURGERY      OB History   No obstetric history on file.      Home Medications    Prior to Admission medications  Medication Sig Start Date End Date Taking? Authorizing Provider  tobramycin  (TOBREX ) 0.3 % ophthalmic solution Place 1 drop into the right eye every 4 (four) hours for 10 days. 01/20/25 01/30/25 Yes Rehana Uncapher K, PA-C  atorvastatin (LIPITOR) 20 MG tablet Take 20 mg by mouth daily. 05/02/24   [provider]  estradiol (ESTRACE) 0.1 MG/GM vaginal cream Place 1 g vaginally every 7 (seven) days.    [provider]  gabapentin  (NEURONTIN ) 100 MG capsule Take 100 mg by mouth 3 (three) times daily.    [provider]  ibuprofen  (ADVIL ,MOTRIN ) 800 MG tablet Take 1 tablet (800 mg total) by mouth 3 (three)  times daily. Patient taking differently: Take 800 mg by mouth every 8 (eight) hours as needed for moderate pain (pain score 4-6). 04/22/17   Leann Satterfield, NP  Multiple Vitamin (MULTIVITAMIN WITH MINERALS) TABS tablet Take 1 tablet by mouth daily.    [provider]  nitrofurantoin , macrocrystal-monohydrate, (MACROBID ) 100 MG capsule Take 100 mg by mouth 2 (two) times daily.    [provider]  Olmesartan -Amlodipine -HCTZ (TRIBENZOR) 40-10-25 MG TABS Take 1 tablet by mouth daily with breakfast.    [provider]  potassium chloride  SA (K-DUR,KLOR-CON ) 20 MEQ tablet Take 1 tablet (20 mEq total) by mouth 2 (two) times daily. 02/17/18   Bluford Rogue, MD  traZODone (DESYREL) 50 MG tablet Take 1 tablet by mouth at bedtime as needed. 10/28/19   [provider]    Family History Family History  Problem Relation Age of Onset   Dementia Mother     Social History Social History[1]   Allergies   Amlodipine  and Ofloxacin   Review of Systems Review of Systems  All other systems reviewed and are negative.    Physical Exam Triage Vital Signs ED Triage Vitals [01/20/25 1712]  Encounter Vitals Group     BP (!) 140/77     Girls Systolic BP Percentile      Girls Diastolic BP Percentile      Boys Systolic BP Percentile  Boys Diastolic BP Percentile      Pulse Rate 78     Resp 18     Temp 97.7 F (36.5 C)     Temp Source Temporal     SpO2 95 %     Weight      Height      Head Circumference      Peak Flow      Pain Score 2     Pain Loc      Pain Education      Exclude from Growth Chart    No data found.  Updated Vital Signs BP (!) 140/77 (BP Location: Left Arm)   Pulse 78   Temp 97.7 F (36.5 C) (Temporal)   Resp 18   SpO2 95%   Visual Acuity Right Eye Distance:   Left Eye Distance:   Bilateral Distance:    Right Eye Near:   Left Eye Near:    Bilateral Near:     Physical Exam Vitals and nursing note reviewed.  Constitutional:       Appearance: She is well-developed.  HENT:     Head: Normocephalic.  Eyes:     General:        Left eye: Discharge present.    Pupils: Pupils are equal, round, and reactive to light.  Cardiovascular:     Rate and Rhythm: Normal rate.  Pulmonary:     Effort: Pulmonary effort is normal.  Abdominal:     General: There is no distension.  Musculoskeletal:        General: Normal range of motion.  Skin:    General: Skin is warm.  Neurological:     General: No focal deficit present.     Mental Status: She is alert and oriented to person, place, and time.      UC Treatments / Results  Labs (all labs ordered are listed, but only abnormal results are displayed) Labs Reviewed - No data to display  EKG   Radiology No results found.  Procedures Procedures (including critical care time)  Medications Ordered in UC Medications - No data to display  Initial Impression / Assessment and Plan / UC Course  I have reviewed the triage vital signs and the nursing notes.  Pertinent labs & imaging results that were available during my care of the patient were reviewed by me and considered in my medical decision making (see chart for details).     An After Visit Summary was printed and given to the patient.     Final Clinical Impressions(s) / UC Diagnoses   Final diagnoses:  Conjunctivitis of left eye, unspecified conjunctivitis type     Discharge Instructions      Return if any problems.    ED Prescriptions     Medication Sig Dispense Auth. Provider   tobramycin  (TOBREX ) 0.3 % ophthalmic solution Place 1 drop into the right eye every 4 (four) hours for 10 days. 3 mL [place] Sonny POUR, PA-C      PDMP not reviewed this encounter.    [1]  Social History Tobacco Use   Smoking status: Never   Smokeless tobacco: Never  Vaping Use   Vaping status: Never Used  Substance Use Topics   Alcohol use: Yes    Comment: socially   Drug use: No     [place] Sonny POUR,  PA-C 01/20/25 1728  "

## 2025-01-20 NOTE — ED Triage Notes (Signed)
 Pt here for bilateral eye redness x 5 days with some URI sx
